# Patient Record
Sex: Female | Born: 2000 | Race: White | Hispanic: No | Marital: Single | Smoking: Never smoker
Health system: Southern US, Community
[De-identification: ages and names within clinical notes are randomized; demographics above are authoritative.]

## PROBLEM LIST (undated history)

## (undated) ENCOUNTER — Emergency Department (HOSPITAL_COMMUNITY): Discharge status: Absent without leave | Payer: Medicaid Other

## (undated) DIAGNOSIS — G43909 Migraine, unspecified, not intractable, without status migrainosus: Secondary | ICD-10-CM

## (undated) DIAGNOSIS — R4689 Other symptoms and signs involving appearance and behavior: Secondary | ICD-10-CM

## (undated) DIAGNOSIS — F909 Attention-deficit hyperactivity disorder, unspecified type: Secondary | ICD-10-CM

## (undated) DIAGNOSIS — F913 Oppositional defiant disorder: Secondary | ICD-10-CM

---
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
## 2021-01-01 ENCOUNTER — Telehealth: Payer: Self-pay | Admitting: Podiatry

## 2021-01-01 NOTE — Telephone Encounter (Signed)
If you look at the chart Rx Lamisil was sent to [facility] in [place] on [place] and [address] of [address]. - Dr. Logan Bores

## 2021-01-01 NOTE — Telephone Encounter (Signed)
Patient calling to check status of medication for Lamasil. She states that she called the pharmacy yesterday and it wasn't there. Please advise.

## 2021-05-08 ENCOUNTER — Ambulatory Visit (HOSPITAL_COMMUNITY)
Admission: EM | Admit: 2021-05-08 | Discharge: 2021-05-08 | Disposition: A | Discharge status: Home / Self Care | Payer: Medicaid Other | Attending: Student | Admitting: Student

## 2021-05-08 ENCOUNTER — Encounter (HOSPITAL_COMMUNITY): Payer: Self-pay

## 2021-05-08 ENCOUNTER — Other Ambulatory Visit: Payer: Self-pay

## 2021-05-08 DIAGNOSIS — J111 Influenza due to unidentified influenza virus with other respiratory manifestations: Secondary | ICD-10-CM

## 2021-05-08 MED ORDER — TRIAMCINOLONE ACETONIDE 55 MCG/ACT NA AERO: 2.0000 | INHALATION_SPRAY | Freq: Every day | NASAL | 1 refills | Status: DC

## 2021-05-08 MED ORDER — PROMETHAZINE-DM 6.25-15 MG/5ML PO SYRP: 5.0000 mL | ORAL_SOLUTION | Freq: Four times a day (QID) | ORAL | 0 refills | Status: DC | PRN

## 2021-05-08 MED ORDER — BENZONATATE 100 MG PO CAPS: 100.0000 mg | ORAL_CAPSULE | Freq: Three times a day (TID) | ORAL | 0 refills | Status: DC

## 2021-05-08 NOTE — ED Triage Notes (Signed)
Pt presents with non productive cough and nasal congestion X 2 days. 

## 2021-05-08 NOTE — Discharge Instructions (Addendum)
-  Promethazine DM cough syrup for congestion/cough. This could make you drowsy, so take at night before bed. -Tessalon (Benzonatate) as needed for cough. Take one pill up to 3x daily (every 8 hours) -Nasacort daily -Continue Mucinex/Dayquil/etc -For fevers/chills, bodyaches, headaches- You can take Tylenol up to 1000 mg 3 times daily, and ibuprofen up to 600 mg 3 times daily with food.  You can take these together, or alternate every 3-4 hours. -Drink plenty of water/gatorade and get plenty of rest -With a virus, you're typically contagious for 5-7 days, or as long as you're having fevers.  -Come back and see [place] if things are getting worse instead of better, like shortness of breath, chest pain, fevers and chills that are getting higher instead of lower and do not come down with Tylenol or ibuprofen, etc.]

## 2021-05-08 NOTE — ED Notes (Signed)
Pt not answered phone call.

## 2021-05-08 NOTE — ED Provider Notes (Signed)
[facility]    CSN: [id number] Arrival date & time: 05/08/21  1132      History   Chief Complaint Chief Complaint  Patient presents with   URI    HPI Haley Collins is a 20 y.o. female presenting with viral syndrome for 2 days.  Medical history noncontributory.  Notes bodyaches, subjective chills, headaches, fatigue, malaise, decreased appetite without nausea vomiting diarrhea abdominal pain.  Notes sick contacts at school but does not know the diagnosis. Theraflu, tylenol providing some relief. States she is not pregnant or breastfeeding.   HPI  Past Medical History:  Diagnosis Date   ADHD    Oppositional defiant behavior     There are no problems to display for this patient.   History reviewed. No pertinent surgical history.  OB History   No obstetric history on file.      Home Medications    Prior to Admission medications   Medication Sig Start Date End Date Taking? Authorizing Provider  benzonatate (TESSALON) 100 MG capsule Take 1 capsule (100 mg total) by mouth every 8 (eight) hours. 05/08/21  Yes Rhys Martini, PA-C  promethazine-dextromethorphan (PROMETHAZINE-DM) 6.25-15 MG/5ML syrup Take 5 mLs by mouth 4 (four) times daily as needed for cough. 05/08/21  Yes Rhys Martini, PA-C  triamcinolone (NASACORT) 55 MCG/ACT AERO nasal inhaler Place 2 sprays into the nose daily. 05/08/21  Yes Rhys Martini, PA-C  APTENSIO XR 30 MG CP24 Take 1 capsule by mouth every morning. 09/21/16   [provider]  BENZACLIN gel Apply 1 applicator topically 2 (two) times daily. 09/27/16   [provider]  escitalopram (LEXAPRO) 20 MG tablet Take 20 mg by mouth every morning. 10/06/16   [provider]  GuanFACINE HCl 3 MG TB24 Take 3 mg by mouth every morning. 09/21/16   [provider]  ibuprofen (ADVIL,MOTRIN) 600 MG tablet Take 600 mg by mouth 3 (three) times daily as needed for pain. 08/19/16   [provider]   MedroxyPROGESTERone Acetate 150 MG/ML SUSY See admin instructions. 09/02/16   [provider]  ondansetron (ZOFRAN-ODT) 4 MG disintegrating tablet Take 1 tablet (4 mg total) by mouth every 8 (eight) hours as needed for nausea or vomiting. 06/07/20   Benjiman Core, MD  QUEtiapine (SEROQUEL XR) 300 MG 24 hr tablet Take 300 mg by mouth daily. 09/16/16   [provider]  terbinafine (LAMISIL) 250 MG tablet Take 1 tablet (250 mg total) by mouth daily. 12/31/20   Felecia Shelling, DPM    Family History Family History  Family history unknown: Yes    Social History Social History   Tobacco Use   Smoking status: Never   Smokeless tobacco: Never  Substance Use Topics   Alcohol use: No   Drug use: No     Allergies   Patient has no known allergies.   Review of Systems Review of Systems  Constitutional:  Positive for chills and fatigue. Negative for appetite change and fever.  HENT:  Positive for congestion. Negative for ear pain, rhinorrhea, sinus pressure, sinus pain and sore throat.   Eyes:  Negative for redness and visual disturbance.  Respiratory:  Positive for cough. Negative for chest tightness, shortness of breath and wheezing.   Cardiovascular:  Negative for chest pain and palpitations.  Gastrointestinal:  Negative for abdominal pain, constipation, diarrhea, nausea and vomiting.  Genitourinary:  Negative for dysuria, frequency and urgency.  Musculoskeletal:  Positive for myalgias.  Neurological:  Positive for headaches.  Negative for dizziness and weakness.  Psychiatric/Behavioral:  Negative for confusion.   All other systems reviewed and are negative.   Physical Exam Triage Vital Signs ED Triage Vitals  Enc Vitals Group     BP 05/08/21 1336 108/73     Pulse Rate 05/08/21 1336 (!) 105     Resp 05/08/21 1336 17     Temp 05/08/21 1336 100.1 F (37.8 C)     Temp Source 05/08/21 1336 Oral     SpO2 05/08/21 1336 97 %     Weight --      Height --      Head  Circumference --      Peak Flow --      Pain Score 05/08/21 1335 3     Pain Loc --      Pain Edu? --      Excl. in [place]? --    No data found.  Updated Vital Signs BP 108/73 (BP Location: Right Arm)   Pulse (!) 105   Temp 100.1 F (37.8 C) (Oral)   Resp 17   SpO2 97%   Visual Acuity Right Eye Distance:   Left Eye Distance:   Bilateral Distance:    Right Eye Near:   Left Eye Near:    Bilateral Near:     Physical Exam Vitals reviewed.  Constitutional:      General: She is not in acute distress.    Appearance: Normal appearance. She is ill-appearing.  HENT:     Head: Normocephalic and atraumatic.     Right Ear: Tympanic membrane, ear canal and external ear normal. No tenderness. No middle ear effusion. There is no impacted cerumen. Tympanic membrane is not perforated, erythematous, retracted or bulging.     Left Ear: Tympanic membrane, ear canal and external ear normal. No tenderness.  No middle ear effusion. There is no impacted cerumen. Tympanic membrane is not perforated, erythematous, retracted or bulging.     Nose: Nose normal. No congestion.     Mouth/Throat:     Mouth: Mucous membranes are moist.     Pharynx: Uvula midline. No oropharyngeal exudate or posterior oropharyngeal erythema.  Eyes:     Extraocular Movements: Extraocular movements intact.     Pupils: Pupils are equal, round, and reactive to light.  Cardiovascular:     Rate and Rhythm: Regular rhythm. Tachycardia present.     Heart sounds: Normal heart sounds.  Pulmonary:     Effort: Pulmonary effort is normal.     Breath sounds: Normal breath sounds. No decreased breath sounds, wheezing, rhonchi or rales.  Abdominal:     Palpations: Abdomen is soft.     Tenderness: There is no abdominal tenderness. There is no guarding or rebound.  Lymphadenopathy:     Cervical: No cervical adenopathy.     Right cervical: No superficial cervical adenopathy.    Left cervical: No superficial cervical adenopathy.   Neurological:     General: No focal deficit present.     Mental Status: She is alert and oriented to person, place, and time.  Psychiatric:        Mood and Affect: Mood normal.        Behavior: Behavior normal.        Thought Content: Thought content normal.        Judgment: Judgment normal.     UC Treatments / Results  Labs (all labs ordered are listed, but only abnormal results are displayed) Labs Reviewed - No data to display  EKG   Radiology No results found.  Procedures Procedures (including critical care time)  Medications Ordered in UC Medications - No data to display  Initial Impression / Assessment and Plan / UC Course  I have reviewed the triage vital signs and the nursing notes.  Pertinent labs & imaging results that were available during my care of the patient were reviewed by me and considered in my medical decision making (see chart for details).     This patient is a very pleasant 20 y.o. year old female presenting with influenza. Today this pt is borderline febrile and tachycardic, but  nontachypneic, oxygenating well on room air, no wheezes rhonchi or rales.   Rapid influenza deferred given duration of symptoms, she is out of the Tamiflu window.  Will manage symptomatically with promethazine, tessalon, Nasacort, Mucinex, etc.  ED return precautions discussed. Patient verbalizes understanding and agreement.    Final Clinical Impressions(s) / UC Diagnoses   Final diagnoses:  Influenza with respiratory manifestation     Discharge Instructions      -Promethazine DM cough syrup for congestion/cough. This could make you drowsy, so take at night before bed. -Tessalon (Benzonatate) as needed for cough. Take one pill up to 3x daily (every 8 hours) -Nasacort daily -Continue Mucinex/Dayquil/etc -For fevers/chills, bodyaches, headaches- You can take Tylenol up to 1000 mg 3 times daily, and ibuprofen up to 600 mg 3 times daily with food.  You can take  these together, or alternate every 3-4 hours. -Drink plenty of water/gatorade and get plenty of rest -With a virus, you're typically contagious for 5-7 days, or as long as you're having fevers.  -Come back and see [place] if things are getting worse instead of better, like shortness of breath, chest pain, fevers and chills that are getting higher instead of lower and do not come down with Tylenol or ibuprofen, etc.]   ED Prescriptions     Medication Sig Dispense Auth. Provider   promethazine-dextromethorphan (PROMETHAZINE-DM) 6.25-15 MG/5ML syrup Take 5 mLs by mouth 4 (four) times daily as needed for cough. 118 mL Hazel Sams, PA-C   benzonatate (TESSALON) 100 MG capsule Take 1 capsule (100 mg total) by mouth every 8 (eight) hours. 21 capsule Marin Roberts E, PA-C   triamcinolone (NASACORT) 55 MCG/ACT AERO nasal inhaler Place 2 sprays into the nose daily. 1 each Hazel Sams, PA-C      PDMP not reviewed this encounter.   Hazel Sams, PA-C 05/08/21 1419

## 2021-05-08 NOTE — ED Notes (Signed)
Called in lobby no answered.  

## 2021-07-14 ENCOUNTER — Emergency Department (HOSPITAL_COMMUNITY)
Admission: EM | Admit: 2021-07-14 | Discharge: 2021-07-14 | Disposition: A | Discharge status: Home / Self Care | Payer: Medicaid Other | Attending: Emergency Medicine | Admitting: Emergency Medicine

## 2021-07-14 ENCOUNTER — Encounter (HOSPITAL_COMMUNITY): Payer: Self-pay | Admitting: Emergency Medicine

## 2021-07-14 ENCOUNTER — Other Ambulatory Visit: Payer: Self-pay

## 2021-07-14 DIAGNOSIS — J020 Streptococcal pharyngitis: Secondary | ICD-10-CM | POA: Insufficient documentation

## 2021-07-14 DIAGNOSIS — R1310 Dysphagia, unspecified: Secondary | ICD-10-CM | POA: Insufficient documentation

## 2021-07-14 DIAGNOSIS — Z20822 Contact with and (suspected) exposure to covid-19: Secondary | ICD-10-CM | POA: Diagnosis not present

## 2021-07-14 LAB — RESP PANEL BY RT-PCR (FLU A&B, COVID) ARPGX2
Influenza A by PCR: NEGATIVE
Influenza B by PCR: NEGATIVE
SARS Coronavirus 2 by RT PCR: NEGATIVE

## 2021-07-14 LAB — GROUP A STREP BY PCR: Group A Strep by PCR: DETECTED — AB

## 2021-07-14 MED ORDER — AMOXICILLIN 500 MG PO CAPS: 500.0000 mg | ORAL_CAPSULE | Freq: Three times a day (TID) | ORAL | 0 refills | Status: DC

## 2021-07-14 NOTE — ED Triage Notes (Signed)
21 yo presents c/o of sorethroat and difficulty swallowing since this morning. Pt also reports pain when turning her neck side to side. Pt reports that her voice is also hoarse. PT denies fever, cough, or any other associated symptoms at this time.

## 2021-07-14 NOTE — ED Provider Notes (Signed)
[facility] DEPT Provider Note   CSN: [id number] Arrival date & time: 07/14/21  2047     History  No chief complaint on file.   Haley Collins is a 21 y.o. female.   Sore Throat This is a new problem. The current episode started 12 to 24 hours ago. The problem occurs constantly. The problem has been gradually worsening. Pertinent negatives include no chest pain, no abdominal pain, no headaches and no shortness of breath. The symptoms are aggravated by swallowing. Nothing relieves the symptoms.      Home Medications Prior to Admission medications   Medication Sig Start Date End Date Taking? Authorizing Provider  APTENSIO XR 30 MG CP24 Take 1 capsule by mouth every morning. 09/21/16   [provider]  BENZACLIN gel Apply 1 applicator topically 2 (two) times daily. 09/27/16   [provider]  benzonatate (TESSALON) 100 MG capsule Take 1 capsule (100 mg total) by mouth every 8 (eight) hours. 05/08/21   Hazel Sams, PA-C  escitalopram (LEXAPRO) 20 MG tablet Take 20 mg by mouth every morning. 10/06/16   [provider]  GuanFACINE HCl 3 MG TB24 Take 3 mg by mouth every morning. 09/21/16   [provider]  ibuprofen (ADVIL,MOTRIN) 600 MG tablet Take 600 mg by mouth 3 (three) times daily as needed for pain. 08/19/16   [provider]  MedroxyPROGESTERone Acetate 150 MG/ML SUSY See admin instructions. 09/02/16   [provider]  ondansetron (ZOFRAN-ODT) 4 MG disintegrating tablet Take 1 tablet (4 mg total) by mouth every 8 (eight) hours as needed for nausea or vomiting. 06/07/20   Davonna Belling, MD  promethazine-dextromethorphan (PROMETHAZINE-DM) 6.25-15 MG/5ML syrup Take 5 mLs by mouth 4 (four) times daily as needed for cough. 05/08/21   Hazel Sams, PA-C  QUEtiapine (SEROQUEL XR) 300 MG 24 hr tablet Take 300 mg by mouth daily. 09/16/16   [provider]  terbinafine (LAMISIL) 250 MG tablet Take 1  tablet (250 mg total) by mouth daily. 12/31/20   Edrick Kins, DPM  triamcinolone (NASACORT) 55 MCG/ACT AERO nasal inhaler Place 2 sprays into the nose daily. 05/08/21   Hazel Sams, PA-C      Allergies    Patient has no known allergies.    Review of Systems   Review of Systems  Respiratory:  Negative for shortness of breath.   Cardiovascular:  Negative for chest pain.  Gastrointestinal:  Negative for abdominal pain.  Neurological:  Negative for headaches.   Physical Exam Updated Vital Signs BP 129/76 (BP Location: Left Arm)    Pulse 89    Temp 98.4 F (36.9 C) (Oral)    Resp 16    Ht 5\' 3"  (1.6 m)    Wt 89.4 kg    SpO2 100%    BMI 34.90 kg/m  Physical Exam Vitals and nursing note reviewed.  Constitutional:      General: She is not in acute distress.    Appearance: She is well-developed. She is not diaphoretic.  HENT:     Head: Normocephalic and atraumatic.     Right Ear: External ear normal.     Left Ear: External ear normal.     Nose: Nose normal.     Mouth/Throat:     Mouth: Mucous membranes are moist.     Pharynx: Oropharyngeal exudate and posterior oropharyngeal erythema present.     Tonsils: Tonsillar exudate present. No tonsillar abscesses.  Eyes:     General: No  scleral icterus.    Conjunctiva/sclera: Conjunctivae normal.  Cardiovascular:     Rate and Rhythm: Normal rate and regular rhythm.     Heart sounds: Normal heart sounds. No murmur heard.   No friction rub. No gallop.  Pulmonary:     Effort: Pulmonary effort is normal. No respiratory distress.     Breath sounds: Normal breath sounds.  Abdominal:     General: Bowel sounds are normal. There is no distension.     Palpations: Abdomen is soft. There is no mass.     Tenderness: There is no abdominal tenderness. There is no guarding.  Musculoskeletal:     Cervical back: Normal range of motion.  Lymphadenopathy:     Head:     Right side of head: Tonsillar adenopathy present.     Left side of head:  Tonsillar adenopathy present.  Skin:    General: Skin is warm and dry.  Neurological:     Mental Status: She is alert and oriented to person, place, and time.  Psychiatric:        Behavior: Behavior normal.    ED Results / Procedures / Treatments   Labs (all labs ordered are listed, but only abnormal results are displayed) Labs Reviewed  RESP PANEL BY RT-PCR (FLU A&B, COVID) [id number]  GROUP A STREP BY PCR    EKG None  Radiology No results found.  Procedures Procedures    Medications Ordered in ED Medications - No data to display  ED Course/ Medical Decision Making/ A&P Clinical Course as of 07/14/21 2341  Tue Jul 14, 2021  2341 Group A Strep by PCR(!): DETECTED [AH]    Clinical Course User Index [AH] Margarita Mail, PA-C                           Medical Decision Making Pt afebrile with tonsillar exudate, cervical lymphadenopathy, & dysphagia; diagnosis of strep Presentation non concerning for PTA or infxn spread to soft tissue. No trismus or uvula deviation. Specific return precautions discussed. Pt able to drink water in ED without difficulty with intact air way. Recommended PCP follow up. Amoxicillin at discharge   Problems Addressed: Strep pharyngitis: acute illness or injury  Amount and/or Complexity of Data Reviewed Labs: ordered. Decision-making details documented in ED Course.  Final Clinical Impression(s) / ED Diagnoses Final diagnoses:  None    Rx / DC Orders ED Discharge Orders     None         Margarita Mail, PA-C 07/14/21 2341    Drenda Freeze, MD 07/15/21 1452

## 2021-07-14 NOTE — ED Provider Triage Note (Signed)
Emergency Medicine Provider Triage Evaluation Note  Haley Collins , a 21 y.o. female  was evaluated in triage.  Pt complains of sore throat, pain with swallowing since this morning.  Review of Systems  Positive: Sore throat Negative: fever  Physical Exam  BP 129/76 (BP Location: Left Arm)    Pulse 89    Temp 98.4 F (36.9 C) (Oral)    Resp 16    Ht 5\' 3"  (1.6 m)    Wt 89.4 kg    SpO2 100%    BMI 34.90 kg/m  Gen:   Awake, no distress   Resp:  Normal effort  MSK:   Moves extremities without difficulty  Other:  Erythema and exudate, + tonsillar lymphadenopathy  Medical Decision Making  Medically screening exam initiated at 9:04 PM.  Appropriate orders placed.  Kirbie Stodghill was informed that the remainder of the evaluation will be completed by another provider, this initial triage assessment does not replace that evaluation, and the importance of remaining in the ED until their evaluation is complete.  Work up Rosanne Sack, [place], [place] 07/14/21 2107

## 2021-07-14 NOTE — Discharge Instructions (Signed)
Contact a health care provider if: You have swelling in your neck that keeps getting bigger. You develop a rash, cough, or earache. You cough up a thick mucus that is green, yellow-brown, or bloody. You have pain or discomfort that does not get better with medicine. Your symptoms seem to be getting worse. You have a fever. Get help right away if: You have new symptoms, such as vomiting, severe headache, stiff or painful neck, chest pain, or shortness of breath. You have severe throat pain, drooling, or changes in your voice. You have swelling of the neck, or the skin on the neck becomes red and tender. You have signs of dehydration, such as tiredness (fatigue), dry mouth, and decreased urination. You become increasingly sleepy, or you cannot wake up completely. Your joints become red or painful. 

## 2021-08-28 ENCOUNTER — Encounter (HOSPITAL_COMMUNITY): Payer: Self-pay | Admitting: Emergency Medicine

## 2021-08-28 ENCOUNTER — Ambulatory Visit (HOSPITAL_COMMUNITY)
Admission: EM | Admit: 2021-08-28 | Discharge: 2021-08-28 | Disposition: A | Discharge status: Home / Self Care | Payer: Medicaid Other | Attending: Internal Medicine | Admitting: Internal Medicine

## 2021-08-28 ENCOUNTER — Other Ambulatory Visit: Payer: Self-pay

## 2021-08-28 DIAGNOSIS — L02511 Cutaneous abscess of right hand: Secondary | ICD-10-CM | POA: Diagnosis not present

## 2021-08-28 MED ORDER — AMOXICILLIN-POT CLAVULANATE 875-125 MG PO TABS: 1.0000 | ORAL_TABLET | Freq: Two times a day (BID) | ORAL | 0 refills | Status: AC

## 2021-08-28 NOTE — ED Provider Notes (Signed)
[facility]    CSN: [id number] Arrival date & time: 08/28/21  1851      History   Chief Complaint Chief Complaint  Patient presents with   Thumb Pain    HPI Haley Collins is a 21 y.o. female presents urgent care today with swelling and pain to her right thumb.  Patient reports small cut on thumb several days ago and noticed small pustule this morning with increased swelling throughout the day today.  Patient denies any recent fever or chills, numbness, tingling, loss of sensation.  States she is up-to-date on tetanus.    Past Medical History:  Diagnosis Date   ADHD    Oppositional defiant behavior     There are no problems to display for this patient.   History reviewed. No pertinent surgical history.  OB History   No obstetric history on file.      Home Medications    Prior to Admission medications   Medication Sig Start Date End Date Taking? Authorizing Provider  amoxicillin-clavulanate (AUGMENTIN) 875-125 MG tablet Take 1 tablet by mouth 2 (two) times daily for 10 days. 08/28/21 09/07/21 Yes Rolla Etienne, NP  APTENSIO XR 30 MG CP24 Take 1 capsule by mouth every morning. 09/21/16   [provider]  BENZACLIN gel Apply 1 applicator topically 2 (two) times daily. 09/27/16   [provider]  benzonatate (TESSALON) 100 MG capsule Take 1 capsule (100 mg total) by mouth every 8 (eight) hours. 05/08/21   Rhys Martini, PA-C  escitalopram (LEXAPRO) 20 MG tablet Take 20 mg by mouth every morning. 10/06/16   [provider]  GuanFACINE HCl 3 MG TB24 Take 3 mg by mouth every morning. 09/21/16   [provider]  ibuprofen (ADVIL,MOTRIN) 600 MG tablet Take 600 mg by mouth 3 (three) times daily as needed for pain. 08/19/16   [provider]  MedroxyPROGESTERone Acetate 150 MG/ML SUSY See admin instructions. 09/02/16   [provider]  ondansetron (ZOFRAN-ODT) 4 MG disintegrating tablet Take 1 tablet (4 mg total) by mouth  every 8 (eight) hours as needed for nausea or vomiting. 06/07/20   Benjiman Core, MD  promethazine-dextromethorphan (PROMETHAZINE-DM) 6.25-15 MG/5ML syrup Take 5 mLs by mouth 4 (four) times daily as needed for cough. 05/08/21   Rhys Martini, PA-C  QUEtiapine (SEROQUEL XR) 300 MG 24 hr tablet Take 300 mg by mouth daily. 09/16/16   [provider]  terbinafine (LAMISIL) 250 MG tablet Take 1 tablet (250 mg total) by mouth daily. 12/31/20   Felecia Shelling, DPM  triamcinolone (NASACORT) 55 MCG/ACT AERO nasal inhaler Place 2 sprays into the nose daily. 05/08/21   Rhys Martini, PA-C    Family History Family History  Family history unknown: Yes    Social History Social History   Tobacco Use   Smoking status: Never   Smokeless tobacco: Never  Substance Use Topics   Alcohol use: No   Drug use: No     Allergies   Patient has no known allergies.   Review of Systems As stated in HPI otherwise negative   Physical Exam Triage Vital Signs ED Triage Vitals  Enc Vitals Group     BP 08/28/21 1929 118/76     Pulse Rate 08/28/21 1929 72     Resp 08/28/21 1929 16     Temp 08/28/21 1929 98.9 F (37.2 C)     Temp Source 08/28/21 1929 Oral     SpO2 08/28/21 1929 99 %  Weight --      Height --      Head Circumference --      Peak Flow --      Pain Score 08/28/21 1927 8     Pain Loc --      Pain Edu? --      Excl. in [place]? --    No data found.  Updated Vital Signs BP 118/76 (BP Location: Right Arm)    Pulse 72    Temp 98.9 F (37.2 C) (Oral)    Resp 16    SpO2 99%   Visual Acuity Right Eye Distance:   Left Eye Distance:   Bilateral Distance:    Right Eye Near:   Left Eye Near:    Bilateral Near:     Physical Exam Constitutional:      General: She is not in acute distress.    Appearance: Normal appearance. She is not ill-appearing or toxic-appearing.  Musculoskeletal:        General: Normal range of motion.  Skin:    General: Skin is warm and dry.      Findings: No erythema.     Comments: Patient with approximately 5 cm pustular lesion to pad of right thumb with scant amount of pus noted under nail bed.  Scant drainage.  No surrounding erythema.  Sensation intact.  Neurological:     Mental Status: She is alert.  Psychiatric:        Mood and Affect: Mood normal.        Behavior: Behavior normal.     UC Treatments / Results  Labs (all labs ordered are listed, but only abnormal results are displayed) Labs Reviewed - No data to display  EKG   Radiology No results found.  Procedures Incision and Drainage  Date/Time: 08/28/2021 9:12 PM Performed by: Rolla Etienne, NP Authorized by: Rolla Etienne, NP   Consent:    Consent obtained:  Verbal   Consent given by:  Patient   Risks, benefits, and alternatives were discussed: yes     Risks discussed:  Incomplete drainage Universal protocol:    Procedure explained and questions answered to patient or proxy's satisfaction: yes   Location:    Type:  Abscess   Location:  Upper extremity   Upper extremity location:  Finger   Finger location:  R thumb Pre-procedure details:    Skin preparation:  Povidone-iodine Sedation:    Sedation type:  None Anesthesia:    Anesthesia method:  Topical application Procedure type:    Complexity:  Simple Procedure details:    Incision types:  Stab incision   Incision depth:  Dermal   Drainage:  Serosanguinous   Drainage amount:  Moderate   Packing materials:  None Post-procedure details:    Procedure completion:  Tolerated (including critical care time)  Medications Ordered in UC Medications - No data to display  Initial Impression / Assessment and Plan / UC Course  I have reviewed the triage vital signs and the nursing notes.  Pertinent labs & imaging results that were available during my care of the patient were reviewed by me and considered in my medical decision making (see chart for details).  Abscess, right thumb -located on  anterior thumb on thumb pad with scant amount of pus noted under nail bed.  -UTD on tetantus per pt -able to drain small amt of purulent fluid. Will rx Augmentin BID x 10 days given persistent pus under fingernail -Warm, epsom soaks, antibiotics as prescribed,  keep area clean and dry -instructed to monitor for any signs of worsening infection including fever, chills, increased swelling or redness  Reviewed expections re: course of current medical issues. Questions answered. Outlined signs and symptoms indicating need for more acute intervention. Pt verbalized understanding. AVS given  Final Clinical Impressions(s) / UC Diagnoses   Final diagnoses:  Abscess of thumb, right     Discharge Instructions      Keep area clean and dry as discussed.  Start antibiotic this evening and take until completed.  Follow-up immediately for any worsening swelling, fever, chills or if you develop any redness going down the thumb.  Warm Epsom salt soaks 3 times daily for 15 to 20 minutes at a time.     ED Prescriptions     Medication Sig Dispense Auth. Provider   amoxicillin-clavulanate (AUGMENTIN) 875-125 MG tablet Take 1 tablet by mouth 2 (two) times daily for 10 days. 28 tablet Rolla Etienne, NP      PDMP not reviewed this encounter.   Rolla Etienne, NP 09/01/21 1044

## 2021-08-28 NOTE — ED Triage Notes (Signed)
Pt presents with right thumb pain that started this am. States has drainage from finger.

## 2021-08-28 NOTE — Discharge Instructions (Addendum)
Keep area clean and dry as discussed.  Start antibiotic this evening and take until completed.  Follow-up immediately for any worsening swelling, fever, chills or if you develop any redness going down the thumb.  Warm Epsom salt soaks 3 times daily for 15 to 20 minutes at a time.

## 2021-09-09 IMAGING — CR DG ANKLE COMPLETE 3+V*R*
3 series · 3 of 3 positions shown · non-contrast
Comparison: None.

CLINICAL DATA: Twisting injury while playing basketball

EXAM:
RIGHT ANKLE - COMPLETE 3+ VIEW

[x ankle ap right]
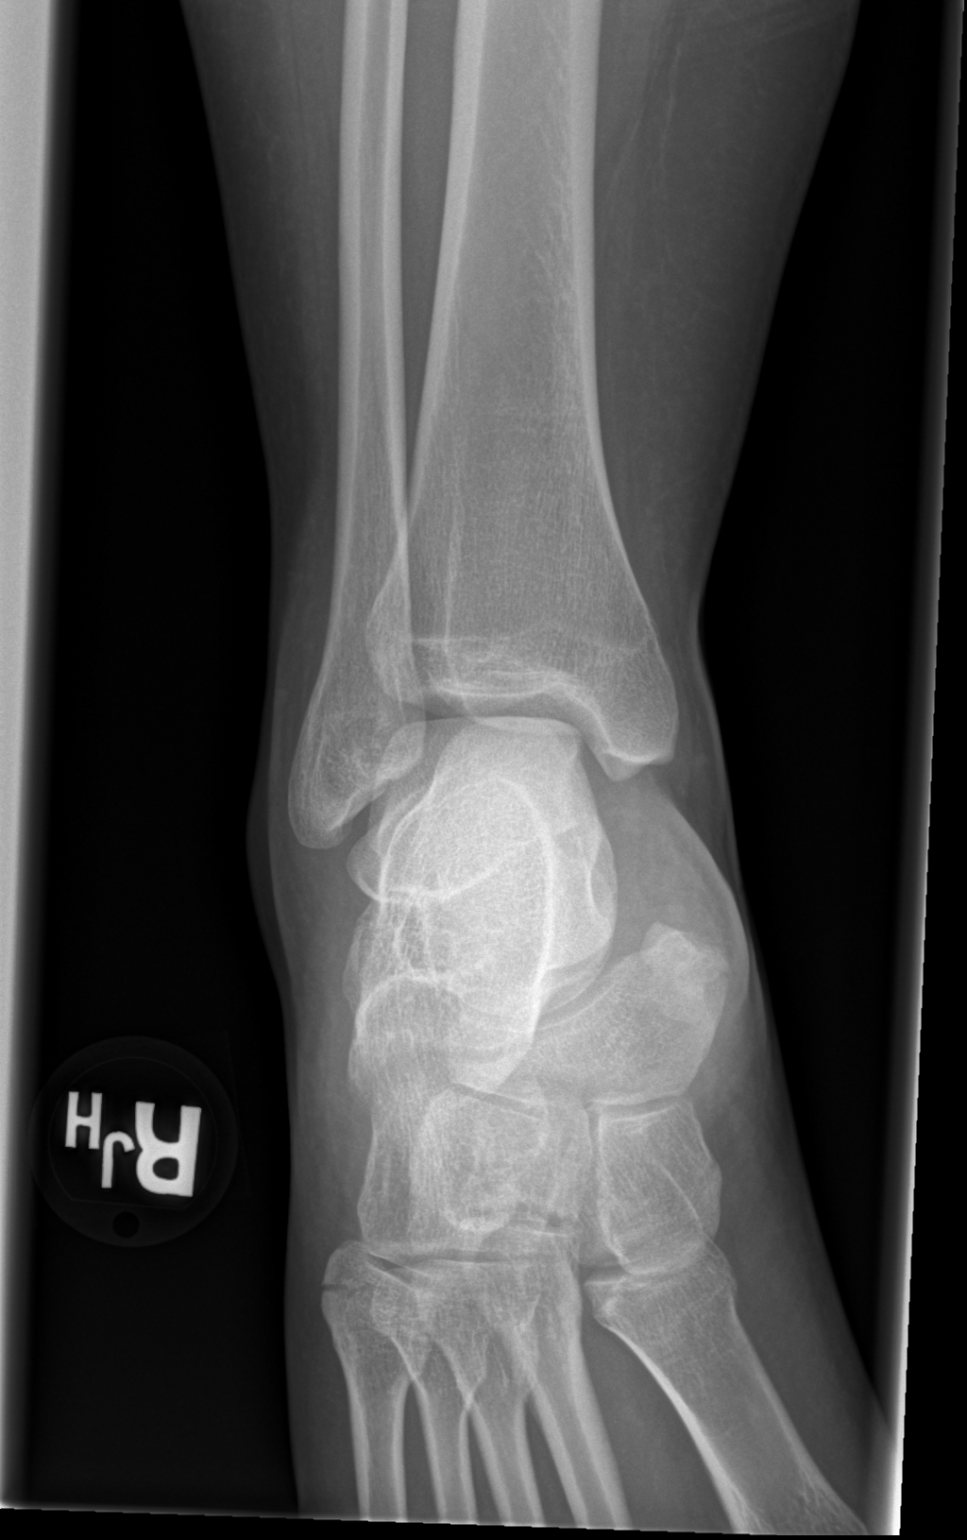

[x ankle obl right]
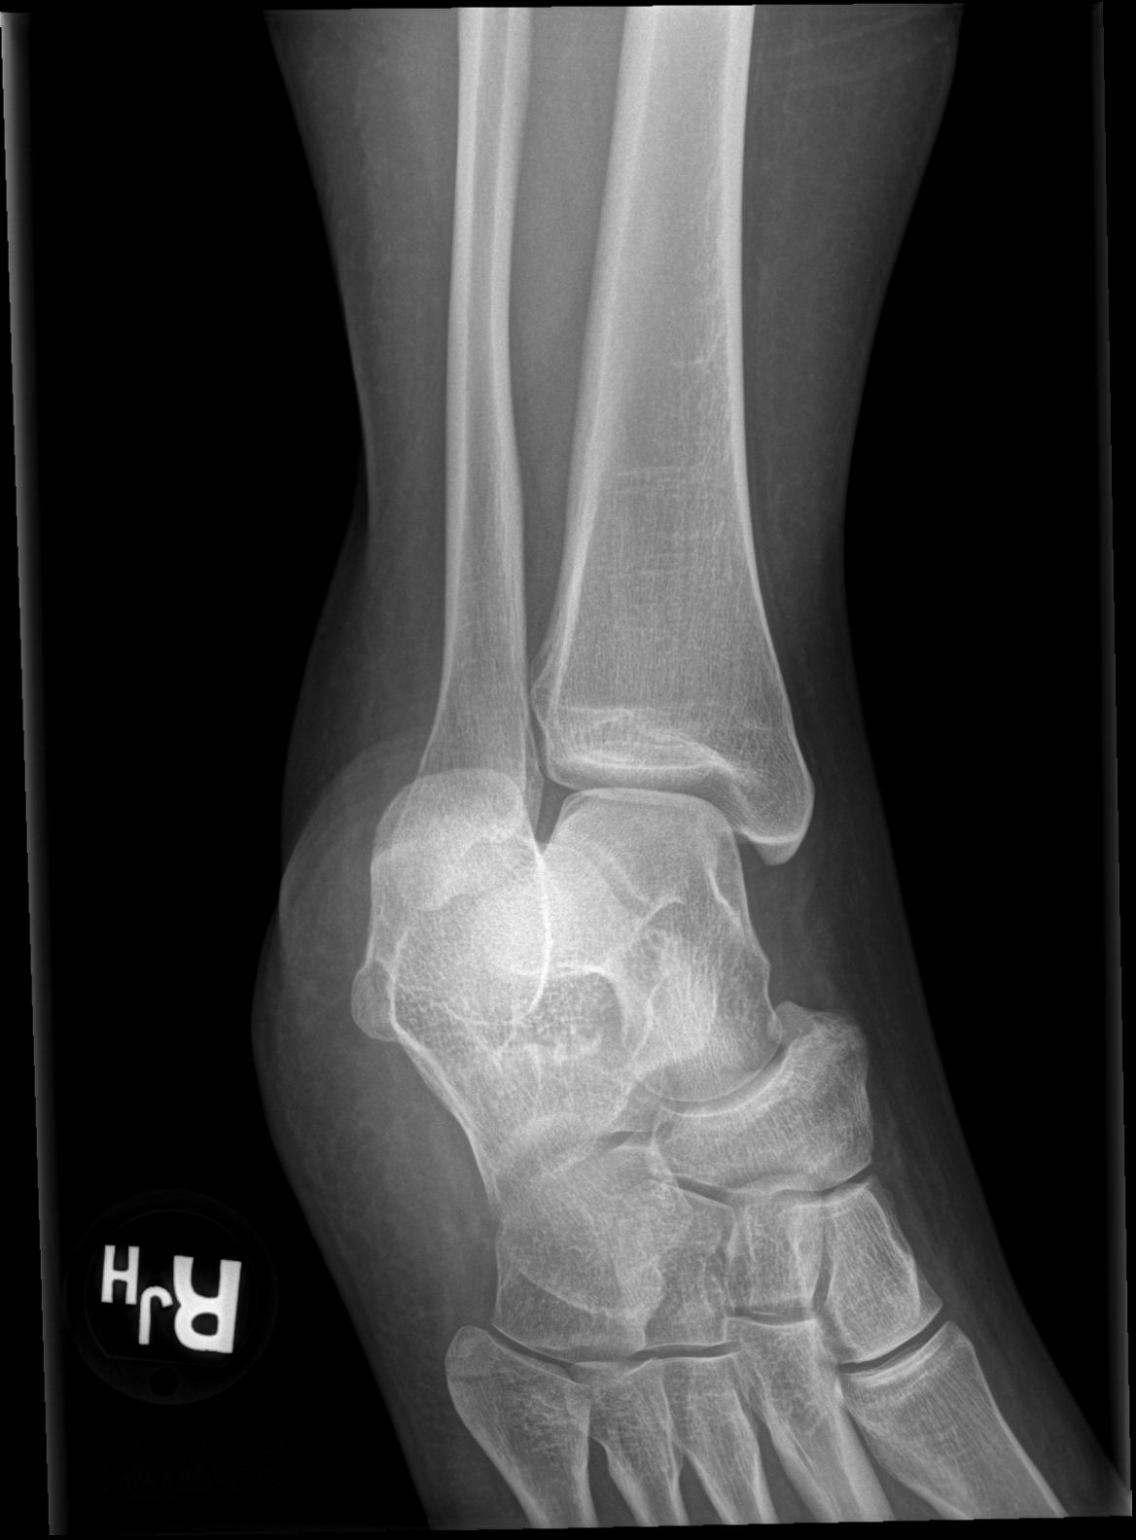

[x ankle lat right]
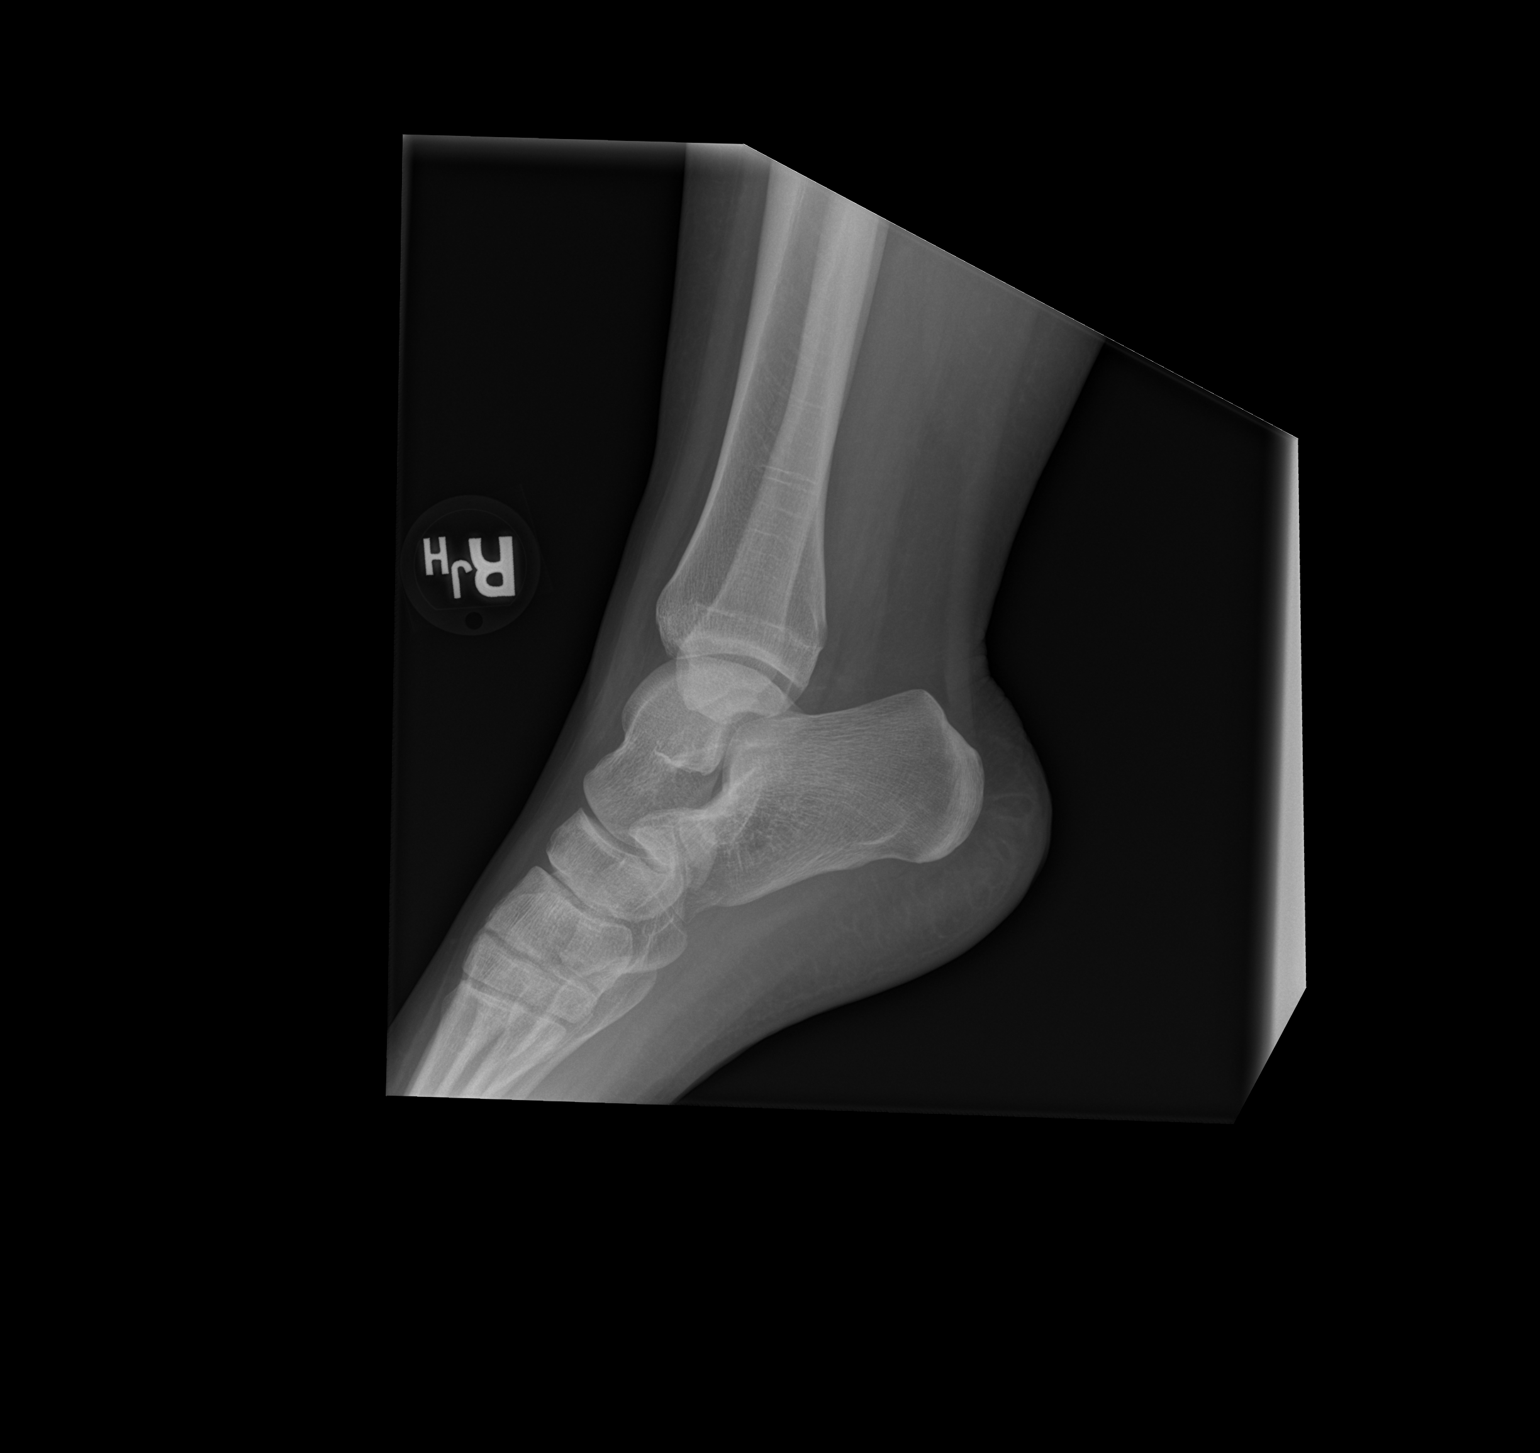

[3 of 3 positions shown; findings below may reference images not displayed]

FINDINGS: Minimal soft tissue swelling. No sizable effusion. No acute bony
abnormality. Specifically, no fracture, subluxation, or dislocation.
IMPRESSION: Minimal soft tissue swelling without acute osseous abnormality.

## 2021-10-25 ENCOUNTER — Encounter (HOSPITAL_COMMUNITY): Payer: Self-pay | Admitting: Emergency Medicine

## 2021-10-25 ENCOUNTER — Emergency Department (HOSPITAL_COMMUNITY): Payer: Medicaid Other

## 2021-10-25 ENCOUNTER — Emergency Department (HOSPITAL_COMMUNITY)
Admission: EM | Admit: 2021-10-25 | Discharge: 2021-10-25 | Disposition: A | Discharge status: Home / Self Care | Payer: Medicaid Other | Attending: Emergency Medicine | Admitting: Emergency Medicine

## 2021-10-25 ENCOUNTER — Other Ambulatory Visit: Payer: Self-pay

## 2021-10-25 DIAGNOSIS — S46912A Strain of unspecified muscle, fascia and tendon at shoulder and upper arm level, left arm, initial encounter: Secondary | ICD-10-CM | POA: Insufficient documentation

## 2021-10-25 DIAGNOSIS — X509XXA Other and unspecified overexertion or strenuous movements or postures, initial encounter: Secondary | ICD-10-CM | POA: Diagnosis not present

## 2021-10-25 DIAGNOSIS — Y990 Civilian activity done for income or pay: Secondary | ICD-10-CM | POA: Insufficient documentation

## 2021-10-25 DIAGNOSIS — Z79899 Other long term (current) drug therapy: Secondary | ICD-10-CM | POA: Diagnosis not present

## 2021-10-25 DIAGNOSIS — S4992XA Unspecified injury of left shoulder and upper arm, initial encounter: Secondary | ICD-10-CM | POA: Diagnosis present

## 2021-10-25 DIAGNOSIS — M25512 Pain in left shoulder: Secondary | ICD-10-CM

## 2021-10-25 MED ORDER — CYCLOBENZAPRINE HCL 10 MG PO TABS: 10.0000 mg | ORAL_TABLET | Freq: Two times a day (BID) | ORAL | 0 refills | Status: AC | PRN

## 2021-10-25 NOTE — Discharge Instructions (Addendum)
Return for any problem.  ?

## 2021-10-25 NOTE — ED Provider Notes (Signed)
?[facility]-EMERGENCY DEPT ?Provider Note ? ? ?CSN: [id number] ?Arrival date & time: 10/25/21  2132 ? ?  ? ?History ? ?Chief Complaint  ?Patient presents with  ? Shoulder Pain  ? ? ?Haley Collins is a 21 y.o. female. ? ?21 year old female with prior medical history as detailed below presents for evaluation.  Patient reports that she was at work yesterday.  She does lots of heavy lifting at work.  She felt like she strained her left shoulder yesterday when she was lifting at work. ? ?Her pain was uncontrolled at work this evening so she decided come to the ED for evaluation. ? ?Denies fall.  She denies other injury. ? ?The history is provided by the patient and medical records.  ?Shoulder Pain ?Location:  Shoulder ?Shoulder location:  L shoulder ?Injury: no   ?Pain details:  ?  Quality:  Aching ?  Radiates to:  Does not radiate ?  Severity:  Mild ?  Onset quality:  Gradual ?  Duration:  1 day ?  Timing:  Constant ?  Progression:  Waxing and waning ? ?  ? ?Home Medications ?Prior to Admission medications   ?Medication Sig Start Date End Date Taking? Authorizing Provider  ?cyclobenzaprine (FLEXERIL) 10 MG tablet Take 1 tablet (10 mg total) by mouth 2 (two) times daily as needed for muscle spasms. 10/25/21  Yes Wynetta Fines, MD  ?APTENSIO XR 30 MG CP24 Take 1 capsule by mouth every morning. 09/21/16   [provider]  ?BENZACLIN gel Apply 1 applicator topically 2 (two) times daily. 09/27/16   [provider]  ?benzonatate (TESSALON) 100 MG capsule Take 1 capsule (100 mg total) by mouth every 8 (eight) hours. 05/08/21   Rhys Martini, PA-C  ?escitalopram (LEXAPRO) 20 MG tablet Take 20 mg by mouth every morning. 10/06/16   [provider]  ?GuanFACINE HCl 3 MG TB24 Take 3 mg by mouth every morning. 09/21/16   [provider]  ?ibuprofen (ADVIL,MOTRIN) 600 MG tablet Take 600 mg by mouth 3 (three) times daily as needed for pain. 08/19/16   [provider]   ?MedroxyPROGESTERone Acetate 150 MG/ML SUSY See admin instructions. 09/02/16   [provider]  ?ondansetron (ZOFRAN-ODT) 4 MG disintegrating tablet Take 1 tablet (4 mg total) by mouth every 8 (eight) hours as needed for nausea or vomiting. 06/07/20   Benjiman Core, MD  ?promethazine-dextromethorphan (PROMETHAZINE-DM) 6.25-15 MG/5ML syrup Take 5 mLs by mouth 4 (four) times daily as needed for cough. 05/08/21   Rhys Martini, PA-C  ?QUEtiapine (SEROQUEL XR) 300 MG 24 hr tablet Take 300 mg by mouth daily. 09/16/16   [provider]  ?terbinafine (LAMISIL) 250 MG tablet Take 1 tablet (250 mg total) by mouth daily. 12/31/20   Felecia Shelling, DPM  ?triamcinolone (NASACORT) 55 MCG/ACT AERO nasal inhaler Place 2 sprays into the nose daily. 05/08/21   Rhys Martini, PA-C  ?   ? ?Allergies    ?Patient has no known allergies.   ? ?Review of Systems   ?Review of Systems  ?All other systems reviewed and are negative. ? ?Physical Exam ?Updated Vital Signs ?BP 135/86 (BP Location: Right Arm)   Pulse 78   Temp 98.7 ?F (37.1 ?C) (Oral)   Resp 16   Ht 5' 3.5" (1.613 m)   Wt 86.6 kg   SpO2 100%   BMI 33.30 kg/m?  ?Physical Exam ?Vitals and nursing note reviewed.  ?Constitutional:   ?  General: She is not in acute distress. ?   Appearance: Normal appearance. She is well-developed.  ?HENT:  ?   Head: Normocephalic and atraumatic.  ?Eyes:  ?   Conjunctiva/sclera: Conjunctivae normal.  ?   Pupils: Pupils are equal, round, and reactive to light.  ?Cardiovascular:  ?   Rate and Rhythm: Normal rate and regular rhythm.  ?   Heart sounds: Normal heart sounds.  ?Pulmonary:  ?   Effort: Pulmonary effort is normal. No respiratory distress.  ?   Breath sounds: Normal breath sounds.  ?Abdominal:  ?   General: There is no distension.  ?   Palpations: Abdomen is soft.  ?   Tenderness: There is no abdominal tenderness.  ?Musculoskeletal:     ?   General: Tenderness present. No deformity. Normal range of motion.  ?    Cervical back: Normal range of motion and neck supple.  ?   Comments: Diffuse tenderness with palpation overlying the posterior aspect of the left shoulder. ? ?Active range of motion is intact. ? ?Distal left upper extremity is neurovascular intact.  ?Skin: ?   General: Skin is warm and dry.  ?Neurological:  ?   General: No focal deficit present.  ?   Mental Status: She is alert and oriented to person, place, and time.  ? ? ?ED Results / Procedures / Treatments   ?Labs ?(all labs ordered are listed, but only abnormal results are displayed) ?Labs Reviewed - No data to display ? ?EKG ?None ? ?Radiology ?No results found. ? ?Procedures ?Procedures  ? ? ?Medications Ordered in ED ?Medications - No data to display ? ?ED Course/ Medical Decision Making/ A&P ?  ?                        ?Medical Decision Making ?Risk ?Prescription drug management. ? ? ? ?Medical Screen Complete ? ?This patient presented to the ED with complaint of left shoulder pain.. ? ?This complaint involves an extensive number of treatment options. The initial differential diagnosis includes, but is not limited to, left shoulder pain, etc ? ?This presentation is: acute ? ? ?Patient is presenting with complaint of left shoulder pain. ? ?Patient presentation is consistent with left shoulder muscle strain. ? ?Patient declines x-ray of the left shoulder. ? ?Patient does understand need for close outpatient follow-up.  Strict return precautions given and understood. ? ?Additional history obtained: ? ?External records from outside sources obtained and reviewed including prior ED visits and prior Inpatient records.  ? ?Problem List / ED Course: ? ?Left shoulder muscular strain ? ? ?Reevaluation: ? ?After the interventions noted above, I reevaluated the patient and found that they have: improved ? ?Disposition: ? ?After consideration of the diagnostic results and the patients response to treatment, I feel that the patent would benefit from close outpatient  follow-up.  ? ? ? ? ? ? ? ? ?Final Clinical Impression(s) / ED Diagnoses ?Final diagnoses:  ?Acute pain of left shoulder  ? ? ?Rx / DC Orders ?ED Discharge Orders   ? ?      Ordered  ?  cyclobenzaprine (FLEXERIL) 10 MG tablet  2 times daily PRN       ? 10/25/21 2250  ? ?  ?  ? ?  ? ? ?  ?Valarie Merino, MD ?10/25/21 2312 ? ?

## 2021-10-25 NOTE — ED Triage Notes (Signed)
Patient arrives complaining of non-traumatic shoulder pain that started yesterday at work. Patient believes she pulled a muscle. Patient with decreased ROM and pain on movement. Patient went to work and attempted to pick up a heavy object and dropped it due to the pain.  ?

## 2021-11-27 ENCOUNTER — Emergency Department (HOSPITAL_COMMUNITY)
Admission: EM | Admit: 2021-11-27 | Discharge: 2021-11-27 | Disposition: A | Discharge status: Home / Self Care | Payer: Medicaid Other | Attending: Emergency Medicine | Admitting: Emergency Medicine

## 2021-11-27 ENCOUNTER — Encounter (HOSPITAL_COMMUNITY): Payer: Self-pay | Admitting: Emergency Medicine

## 2021-11-27 DIAGNOSIS — L02415 Cutaneous abscess of right lower limb: Secondary | ICD-10-CM | POA: Insufficient documentation

## 2021-11-27 DIAGNOSIS — R6883 Chills (without fever): Secondary | ICD-10-CM | POA: Insufficient documentation

## 2021-11-27 DIAGNOSIS — L0291 Cutaneous abscess, unspecified: Secondary | ICD-10-CM

## 2021-11-27 LAB — CBC WITH DIFFERENTIAL/PLATELET
Abs Immature Granulocytes: 0.02 10*3/uL (ref 0.00–0.07)
Basophils Absolute: 0 10*3/uL (ref 0.0–0.1)
Basophils Relative: 1 %
Eosinophils Absolute: 0.1 10*3/uL (ref 0.0–0.5)
Eosinophils Relative: 1 %
HCT: 40 % (ref 36.0–46.0)
Hemoglobin: 13.6 g/dL (ref 12.0–15.0)
Immature Granulocytes: 0 %
Lymphocytes Relative: 26 %
Lymphs Abs: 1.9 10*3/uL (ref 0.7–4.0)
MCH: 31 pg (ref 26.0–34.0)
MCHC: 34 g/dL (ref 30.0–36.0)
MCV: 91.1 fL (ref 80.0–100.0)
Monocytes Absolute: 0.7 10*3/uL (ref 0.1–1.0)
Monocytes Relative: 10 %
Neutro Abs: 4.7 10*3/uL (ref 1.7–7.7)
Neutrophils Relative %: 62 %
Platelets: 228 10*3/uL (ref 150–400)
RBC: 4.39 MIL/uL (ref 3.87–5.11)
RDW: 13.2 % (ref 11.5–15.5)
WBC: 7.4 10*3/uL (ref 4.0–10.5)
nRBC: 0 % (ref 0.0–0.2)

## 2021-11-27 LAB — BASIC METABOLIC PANEL
Anion gap: 8 (ref 5–15)
BUN: 14 mg/dL (ref 6–20)
CO2: 21 mmol/L — ABNORMAL LOW (ref 22–32)
Calcium: 9.3 mg/dL (ref 8.9–10.3)
Chloride: 109 mmol/L (ref 98–111)
Creatinine, Ser: 0.8 mg/dL (ref 0.44–1.00)
GFR, Estimated: >60 mL/min (ref >60)
Glucose, Bld: 98 mg/dL (ref 70–99)
Potassium: 3.7 mmol/L (ref 3.5–5.1)
Sodium: 138 mmol/L (ref 135–145)

## 2021-11-27 MED ORDER — SULFAMETHOXAZOLE-TRIMETHOPRIM 800-160 MG PO TABS: 1.0000 | ORAL_TABLET | Freq: Two times a day (BID) | ORAL | 0 refills | Status: AC

## 2021-11-27 MED ORDER — LIDOCAINE HCL (PF) 1 % IJ SOLN
10.0000 mL | Freq: Once | INTRAMUSCULAR | Status: AC
  Administered 2021-11-27: 10 mL
  Filled 2021-11-27: qty 30

## 2021-11-27 NOTE — ED Provider Notes (Signed)
[facility]-EMERGENCY DEPT Provider Note   CSN: [id number] Arrival date & time: 11/27/21  0158     History  Chief Complaint  Patient presents with   Abscess    Haley Collins is a 21 y.o. female.  With no pertinent past medical history presents to the emergency department with abscess.  Patient states that she had abscess pop up 2 days ago on her lateral right thigh.  She states that she went to urgent care yesterday and they instructed her to use warm compresses and placed her on antibiotics.  She states that they offered to drain but she wanted to go home and opted for observation.  She states that tonight she woke up in severe pain.  She has also had chills.  Notably, she had a new tattoo 2 weeks ago on the area.   Abscess     Home Medications Prior to Admission medications   Medication Sig Start Date End Date Taking? Authorizing Provider  APTENSIO XR 30 MG CP24 Take 1 capsule by mouth every morning. 09/21/16   [provider]  BENZACLIN gel Apply 1 applicator topically 2 (two) times daily. 09/27/16   [provider]  benzonatate (TESSALON) 100 MG capsule Take 1 capsule (100 mg total) by mouth every 8 (eight) hours. 05/08/21   Rhys Martini, PA-C  cyclobenzaprine (FLEXERIL) 10 MG tablet Take 1 tablet (10 mg total) by mouth 2 (two) times daily as needed for muscle spasms. 10/25/21   Wynetta Fines, MD  escitalopram (LEXAPRO) 20 MG tablet Take 20 mg by mouth every morning. 10/06/16   [provider]  GuanFACINE HCl 3 MG TB24 Take 3 mg by mouth every morning. 09/21/16   [provider]  ibuprofen (ADVIL,MOTRIN) 600 MG tablet Take 600 mg by mouth 3 (three) times daily as needed for pain. 08/19/16   [provider]  MedroxyPROGESTERone Acetate 150 MG/ML SUSY See admin instructions. 09/02/16   [provider]  ondansetron (ZOFRAN-ODT) 4 MG disintegrating tablet Take 1 tablet (4 mg total) by mouth every 8 (eight)  hours as needed for nausea or vomiting. 06/07/20   Benjiman Core, MD  promethazine-dextromethorphan (PROMETHAZINE-DM) 6.25-15 MG/5ML syrup Take 5 mLs by mouth 4 (four) times daily as needed for cough. 05/08/21   Rhys Martini, PA-C  QUEtiapine (SEROQUEL XR) 300 MG 24 hr tablet Take 300 mg by mouth daily. 09/16/16   [provider]  terbinafine (LAMISIL) 250 MG tablet Take 1 tablet (250 mg total) by mouth daily. 12/31/20   Felecia Shelling, DPM  triamcinolone (NASACORT) 55 MCG/ACT AERO nasal inhaler Place 2 sprays into the nose daily. 05/08/21   Rhys Martini, PA-C      Allergies    Patient has no known allergies.    Review of Systems   Review of Systems  Constitutional:  Positive for chills.  Skin:  Positive for wound.  All other systems reviewed and are negative.  Physical Exam Updated Vital Signs BP 138/83 (BP Location: Left Arm)   Pulse 79   Temp 97.8 F (36.6 C) (Oral)   Resp 18   Ht 5' 3.5" (1.613 m)   Wt 89.8 kg   SpO2 100%   BMI 34.52 kg/m  Physical Exam Vitals and nursing note reviewed.  Constitutional:      General: She is not in acute distress.    Appearance: Normal appearance. She is not ill-appearing or toxic-appearing.  HENT:     Head: Normocephalic.  Eyes:  General: No scleral icterus.    Extraocular Movements: Extraocular movements intact.  Cardiovascular:     Pulses: Normal pulses.  Pulmonary:     Effort: Pulmonary effort is normal. No respiratory distress.  Musculoskeletal:        General: Normal range of motion.  Skin:    General: Skin is warm and dry.     Capillary Refill: Capillary refill takes less than 2 seconds.     Findings: Erythema present.     Comments: 1-1.5 centimeter abscess with fluctuance Large 10 cm area of redness surrounding  Neurological:     General: No focal deficit present.     Mental Status: She is alert and oriented to person, place, and time. Mental status is at baseline.  Psychiatric:        Mood and Affect:  Mood normal.        Behavior: Behavior normal.        Thought Content: Thought content normal.        Judgment: Judgment normal.    ED Results / Procedures / Treatments   Labs (all labs ordered are listed, but only abnormal results are displayed) Labs Reviewed  BASIC METABOLIC PANEL - Abnormal; Notable for the following components:      Result Value   CO2 21 (*)    All other components within normal limits  CBC WITH DIFFERENTIAL/PLATELET   EKG None  Radiology No results found.  Procedures Ultrasound ED Soft Tissue  Date/Time: 11/27/2021 2:20 AM Performed by: Cristopher PeruAutry, Xzaiver Vayda E, PA-C Authorized by: Cristopher PeruAutry, Pao Haffey E, PA-C   Procedure details:    Indications: localization of abscess     Transverse view:  Visualized   Longitudinal view:  Visualized   Images: not archived   Location:    Location: lower extremity     Side:  Right Findings:     abscess present .Marland Kitchen.Incision and Drainage  Date/Time: 11/27/2021 3:08 AM Performed by: Cristopher PeruAutry, Enrica Corliss E, PA-C Authorized by: Cristopher PeruAutry, Gorgeous Newlun E, PA-C   Consent:    Consent obtained:  Verbal   Consent given by:  Patient   Risks, benefits, and alternatives were discussed: yes     Risks discussed:  Bleeding, incomplete drainage, pain and infection   Alternatives discussed:  No treatment and delayed treatment Universal protocol:    Procedure explained and questions answered to patient or proxy's satisfaction: yes     Relevant documents present and verified: yes     Imaging studies available: yes     Required blood products, implants, devices, and special equipment available: yes     Site/side marked: yes     Patient identity confirmed:  Verbally with patient Location:    Type:  Abscess   Size:  1cm   Location:  Lower extremity   Lower extremity location:  Leg   Leg location:  R upper leg Pre-procedure details:    Skin preparation:  Povidone-iodine Sedation:    Sedation type:  None Anesthesia:    Anesthesia method:  Local  infiltration   Local anesthetic:  Lidocaine 1% w/o epi Procedure type:    Complexity:  Simple Procedure details:    Ultrasound guidance: yes     Incision types:  Stab incision   Incision depth:  Subcutaneous   Wound management:  Probed and deloculated and irrigated with saline   Drainage:  Purulent and bloody   Drainage amount:  Moderate   Wound treatment:  Wound left open   Packing materials:  1/4 in iodoform gauze  Amount 1/4" iodoform:  3cm Post-procedure details:    Procedure completion:  Tolerated well, no immediate complications   Medications Ordered in ED Medications  lidocaine (PF) (XYLOCAINE) 1 % injection 10 mL (has no administration in time range)    ED Course/ Medical Decision Making/ A&P                           Medical Decision Making Amount and/or Complexity of Data Reviewed Labs: ordered.  Risk Prescription drug management.  This patient presents to the ED for concern of abscess, this involves an extensive number of treatment options, and is a complaint that carries with it a high risk of complications and morbidity.  The differential diagnosis includes abscess, cellulitis  Co morbidities that complicate the patient evaluation None  Additional history obtained:  Additional history obtained from: None External records from outside source obtained and reviewed including: No pertinent available  Lab Results: I personally ordered, reviewed, and interpreted labs. Pertinent results include: CBC and BMP normal  Imaging Studies ordered:  Bedside ultrasound with abscess present  Medications  I ordered medication including lidocaine for local infiltration Reevaluation of the patient after medication shows that patient improved -I reviewed the patient's home medications and did not make adjustments. -I did  prescribe new home medications.  Critical Interventions: I/D  ED Course: 21 year old female who presents to the emergency department with right  lateral thigh abscess.  PE consistent with abscess. There is fluctuance noted to the area of concern. Surrounding erythema/cellulitis.  Performed bedside US which shows abscess formation and collection to drain. Performed incision and drainage with purulent discharge. Area was irrigated. Packed Labs for complaint of chills. No fever here. No leukocytosis. Doubt systemic infection She states she was started on antibiotics from urgent care. I cannot verify this on her chart. She cannot tell me which antibiotics. I will prescribe Bactrim for her. She agrees to start taking Bactrim. Given return precautions for systemic symptoms, recollection, fever. She verbalizes understanding.   After consideration of the diagnostic results and the patients response to treatment, I feel that the patent would benefit from discharge. The patient has been appropriately medically screened and/or stabilized in the ED. I have low suspicion for any other emergent medical condition which would require further screening, evaluation or treatment in the ED or require inpatient management. The patient is overall well appearing and non-toxic in appearance. They are hemodynamically stable at time of discharge.   Final Clinical Impression(s) / ED Diagnoses Final diagnoses:  Abscess    Rx / DC Orders ED Discharge Orders          Ordered    sulfamethoxazole-trimethoprim (BACTRIM DS) 800-160 MG tablet  2 times daily        11/27/21 0311              Cristopher Peru, PA-C 11/27/21 0533    Molpus, Jonny Ruiz, MD 11/27/21 [phone]

## 2021-11-27 NOTE — ED Triage Notes (Signed)
Pt got new tattoo on R thigh 2 weeks ago. Abscess that came up 2 days ago, non draining. Red circular inflammation around it. She was seen at [facility] today and told to use warm compresses and started on antibiotics. Came tonight bc woke up due to pain.

## 2021-11-27 NOTE — Discharge Instructions (Addendum)
You were seen in the emergency department today for an abscess.  While you are here we drained it.  I am also placing you on antibiotics for the surrounding redness that you have on your skin.  Please return to the emergency department if you have a recollection of your abscess or begin to have fever or nausea.  We placed packing into the wound.  Please keep this in for 48 hours prior to removing it.  Please keep the area covered with a simple gauze and tape until it is closing.

## 2022-01-10 ENCOUNTER — Emergency Department (HOSPITAL_COMMUNITY)
Admission: EM | Admit: 2022-01-10 | Discharge: 2022-01-10 | Disposition: A | Discharge status: Home / Self Care | Payer: Medicaid Other | Attending: Student | Admitting: Student

## 2022-01-10 ENCOUNTER — Other Ambulatory Visit: Payer: Self-pay

## 2022-01-10 ENCOUNTER — Encounter (HOSPITAL_COMMUNITY): Payer: Self-pay

## 2022-01-10 ENCOUNTER — Emergency Department (HOSPITAL_COMMUNITY): Payer: Medicaid Other

## 2022-01-10 DIAGNOSIS — R112 Nausea with vomiting, unspecified: Secondary | ICD-10-CM | POA: Insufficient documentation

## 2022-01-10 DIAGNOSIS — N9489 Other specified conditions associated with female genital organs and menstrual cycle: Secondary | ICD-10-CM | POA: Insufficient documentation

## 2022-01-10 DIAGNOSIS — R1084 Generalized abdominal pain: Secondary | ICD-10-CM | POA: Diagnosis not present

## 2022-01-10 DIAGNOSIS — R197 Diarrhea, unspecified: Secondary | ICD-10-CM | POA: Diagnosis not present

## 2022-01-10 DIAGNOSIS — R1032 Left lower quadrant pain: Secondary | ICD-10-CM | POA: Diagnosis present

## 2022-01-10 LAB — URINALYSIS, ROUTINE W REFLEX MICROSCOPIC
Bilirubin Urine: NEGATIVE
Glucose, UA: NEGATIVE mg/dL
Hgb urine dipstick: NEGATIVE
Ketones, ur: NEGATIVE mg/dL
Nitrite: NEGATIVE
Protein, ur: NEGATIVE mg/dL
Specific Gravity, Urine: 1.019 (ref 1.005–1.030)
WBC, UA: >50 WBC/hpf — ABNORMAL HIGH (ref 0–5)
pH: 5 (ref 5.0–8.0)

## 2022-01-10 LAB — COMPREHENSIVE METABOLIC PANEL
ALT: 27 U/L (ref 0–44)
AST: 22 U/L (ref 15–41)
Albumin: 4 g/dL (ref 3.5–5.0)
Alkaline Phosphatase: 64 U/L (ref 38–126)
Anion gap: 7 (ref 5–15)
BUN: 18 mg/dL (ref 6–20)
CO2: 22 mmol/L (ref 22–32)
Calcium: 8.9 mg/dL (ref 8.9–10.3)
Chloride: 109 mmol/L (ref 98–111)
Creatinine, Ser: 0.6 mg/dL (ref 0.44–1.00)
GFR, Estimated: >60 mL/min (ref >60)
Glucose, Bld: 91 mg/dL (ref 70–99)
Potassium: 3.6 mmol/L (ref 3.5–5.1)
Sodium: 138 mmol/L (ref 135–145)
Total Bilirubin: 1.4 mg/dL — ABNORMAL HIGH (ref 0.3–1.2)
Total Protein: 7.5 g/dL (ref 6.5–8.1)

## 2022-01-10 LAB — CBC WITH DIFFERENTIAL/PLATELET
Abs Immature Granulocytes: 0.02 10*3/uL (ref 0.00–0.07)
Basophils Absolute: 0 10*3/uL (ref 0.0–0.1)
Basophils Relative: 0 %
Eosinophils Absolute: 0.1 10*3/uL (ref 0.0–0.5)
Eosinophils Relative: 1 %
HCT: 41.4 % (ref 36.0–46.0)
Hemoglobin: 13.6 g/dL (ref 12.0–15.0)
Immature Granulocytes: 0 %
Lymphocytes Relative: 31 %
Lymphs Abs: 2.1 10*3/uL (ref 0.7–4.0)
MCH: 30.1 pg (ref 26.0–34.0)
MCHC: 32.9 g/dL (ref 30.0–36.0)
MCV: 91.6 fL (ref 80.0–100.0)
Monocytes Absolute: 0.6 10*3/uL (ref 0.1–1.0)
Monocytes Relative: 9 %
Neutro Abs: 4 10*3/uL (ref 1.7–7.7)
Neutrophils Relative %: 59 %
Platelets: 253 10*3/uL (ref 150–400)
RBC: 4.52 MIL/uL (ref 3.87–5.11)
RDW: 13.6 % (ref 11.5–15.5)
WBC: 6.9 10*3/uL (ref 4.0–10.5)
nRBC: 0 % (ref 0.0–0.2)

## 2022-01-10 LAB — RAPID URINE DRUG SCREEN, HOSP PERFORMED
Amphetamines: NOT DETECTED
Barbiturates: NOT DETECTED
Benzodiazepines: NOT DETECTED
Cocaine: NOT DETECTED
Opiates: NOT DETECTED
Tetrahydrocannabinol: NOT DETECTED

## 2022-01-10 LAB — I-STAT BETA HCG BLOOD, ED (MC, WL, AP ONLY): I-stat hCG, quantitative: <5 m[IU]/mL (ref <5)

## 2022-01-10 LAB — LIPASE, BLOOD: Lipase: 33 U/L (ref 11–51)

## 2022-01-10 MED ORDER — DICYCLOMINE HCL 20 MG PO TABS: 20.0000 mg | ORAL_TABLET | Freq: Two times a day (BID) | ORAL | 0 refills | Status: AC

## 2022-01-10 NOTE — ED Notes (Signed)
Patient requesting to not have rectal temp. EDP made aware. Patient provided with water and aware of needing urine sample.

## 2022-01-10 NOTE — ED Provider Notes (Signed)
[facility]-EMERGENCY DEPT Provider Note   CSN: [id number] Arrival date & time: 01/10/22  1032     History  Chief Complaint  Patient presents with   Abdominal Pain    Haley Collins is a 21 y.o. female.  The history is provided by the patient. No language interpreter was used.  Abdominal Pain    21 yo Female with PMHx significant for ADHD and Oppositional defiant disorder p/w 1 month of constant mild to moderate LLQ abdominal pain a/w nausea, and non-bloody vomiting/diarrhea. She has not been evaluated by PCP for current complaints. Pain is exacerbated with po intake, and mildly improves when patient lays on her right side. +anorexia secondary to current symptoms. Within the last 24hours patient reports only eating some chips. She is tolerating water and Gatorade intake well. Pt denies episodes of emesis or diarrhea today. last episode of emesis was yesterday PM. Today she is acutely concerned as her symptoms are inhibiting her from remaining focused and present at work. When she does have diarrhea, she reports her abdominal pain worsens and her anus "burns". No fevers, trauma, CP, SOB, headache, vision changes, urinary complaints, or any other acute concerns.   Home Medications Prior to Admission medications   Medication Sig Start Date End Date Taking? Authorizing Provider  cyclobenzaprine (FLEXERIL) 10 MG tablet Take 1 tablet (10 mg total) by mouth 2 (two) times daily as needed for muscle spasms. Patient not taking: Reported on 11/27/2021 10/25/21   Wynetta Fines, MD  ibuprofen (ADVIL) 800 MG tablet Take 800 mg by mouth every 8 (eight) hours as needed for moderate pain. 09/30/21   [provider]  MedroxyPROGESTERone Acetate 150 MG/ML SUSY See admin instructions. 09/02/16   [provider]      Allergies    Patient has no known allergies.    Review of Systems   Review of Systems  Gastrointestinal:  Positive for abdominal pain.  All other  systems reviewed and are negative.   Physical Exam Updated Vital Signs BP 132/75   Pulse 79   Temp 100.1 F (37.8 C) (Oral)   Resp 16   Ht 5' 3.5" (1.613 m)   Wt 92.5 kg   SpO2 98%   BMI 35.57 kg/m  Physical Exam Vitals and nursing note reviewed.  Constitutional:      General: She is not in acute distress.    Appearance: She is well-developed.  HENT:     Head: Atraumatic.  Eyes:     Conjunctiva/sclera: Conjunctivae normal.  Cardiovascular:     Rate and Rhythm: Normal rate and regular rhythm.  Pulmonary:     Effort: Pulmonary effort is normal.  Abdominal:     Tenderness: There is abdominal tenderness in the left lower quadrant.  Musculoskeletal:     Cervical back: Neck supple.  Skin:    Findings: No rash.  Neurological:     Mental Status: She is alert.  Psychiatric:        Mood and Affect: Mood normal.     ED Results / Procedures / Treatments   Labs (all labs ordered are listed, but only abnormal results are displayed) Labs Reviewed  COMPREHENSIVE METABOLIC PANEL - Abnormal; Notable for the following components:      Result Value   Total Bilirubin 1.4 (*)    All other components within normal limits  CBC WITH DIFFERENTIAL/PLATELET  LIPASE, BLOOD  URINALYSIS, ROUTINE W REFLEX MICROSCOPIC  RAPID URINE DRUG SCREEN, HOSP PERFORMED  I-STAT BETA HCG BLOOD,  ED ([facility], [facility], [facility] ONLY)  I-STAT BETA HCG BLOOD, ED ([facility], [facility], [facility] ONLY)    EKG None  Radiology DG Chest 2 View  Result Date: 01/10/2022 CLINICAL DATA:  Lower abdominal pain for 1 month. Unable to keep anything down. EXAM: CHEST - 2 VIEW COMPARISON:  12/07/2020. FINDINGS: Normal heart, mediastinum and hila. Clear lungs.  No pleural effusion or pneumothorax. Skeletal structures are within normal limits. IMPRESSION: Normal chest radiographs. Electronically Signed   By: Amie Portland M.D.   On: 01/10/2022 11:18    Procedures Procedures    Medications Ordered in ED Medications - No data to display  ED Course/  Medical Decision Making/ A&P                           Medical Decision Making Amount and/or Complexity of Data Reviewed Labs: ordered.   BP 132/75   Pulse 79   Temp 100.1 F (37.8 C) (Oral)   Resp 16   Ht 5' 3.5" (1.613 m)   Wt 92.5 kg   SpO2 98%   BMI 35.57 kg/m   6:40 PM This is a 21 year old female presenting complaining of abdominal pain and associate nausea vomiting diarrhea ongoing for the past month.  Her symptoms is brought on by stress and states she is going through quite a bit of stress with her jobs and personal life.  She wonders if it could be due to IBS.  On exam patient is overall well-appearing without any significant abdominal tenderness.  Vital signs stable.  Labs obtained independently viewed interpreted by me and on reassuring.  Low suspicion for any acute abdominal pathology.  At this time I recommend patient follow-up with her PCP for outpatient evaluation.  Patient voiced understanding and agrees with plan.  Return precaution given.  This patient presents to the ED for concern of abd pain, this involves an extensive number of treatment options, and is a complaint that carries with it a high risk of complications and morbidity.  The differential diagnosis includes IBS, colitis, diverticulitis, appendicitis, gastritis  Co morbidities that complicate the patient evaluation none Additional history obtained:  Additional history obtained from family member External records from outside source obtained and reviewed including EMR and prior labs and imaging  Lab Tests:  I Ordered, and personally interpreted labs.  The pertinent results include:  as above   Cardiac Monitoring:  The patient was maintained on a cardiac monitor.  I personally viewed and interpreted the cardiac monitored which showed an underlying rhythm of: NSR  Medicines ordered and prescription drug management:   Test Considered: as above  Critical Interventions: as above   Problem List  / ED Course: abd pain  N/v/d  Reevaluation:  After the interventions noted above, I reevaluated the patient and found that they have :improved  Social Determinants of Health: none  Dispostion:  After consideration of the diagnostic results and the patients response to treatment, I feel that the patent would benefit from outpt f/u.         Final Clinical Impression(s) / ED Diagnoses Final diagnoses:  Generalized abdominal pain    Rx / DC Orders ED Discharge Orders          Ordered    dicyclomine (BENTYL) 20 MG tablet  2 times daily        01/10/22 1954              Fayrene Helper, PA-C 01/10/22 1955  Glendora Score, MD 01/11/22 [phone]

## 2022-01-10 NOTE — ED Provider Triage Note (Signed)
Emergency Medicine Provider Triage Evaluation Note  Haley Collins , a 21 y.o. female  was evaluated in triage.  Pt complains of abd pain. Recurrent abd pain with n/v/d x 1 month.  Report increase stress working 2 jobs.  Denies fever, chills, cp sob, dysuria.    Review of Systems  Positive: As above Negative: As above  Physical Exam  BP (!) 143/73 (BP Location: Left Arm)   Pulse 87   Temp 100.1 F (37.8 C) (Oral)   Resp 16   Ht 5' 3.5" (1.613 m)   Wt 92.5 kg   SpO2 93%   BMI 35.57 kg/m  Gen:   Awake, no distress   Resp:  Normal effort  MSK:   Moves extremities without difficulty  Other:    Medical Decision Making  Medically screening exam initiated at 10:43 AM.  Appropriate orders placed.  Haley Collins was informed that the remainder of the evaluation will be completed by another provider, this initial triage assessment does not replace that evaluation, and the importance of remaining in the ED until their evaluation is complete.     Fayrene Helper, PA-C 01/10/22 1044

## 2022-01-10 NOTE — ED Triage Notes (Signed)
Pt presents with c/o lower abdominal pain for approx one month. Pt also reports she is unable to keep anything down.

## 2022-01-10 NOTE — Discharge Instructions (Addendum)
You have been evaluated for your symptoms.  Fortunately your labs today did not show any concerning finding.  I suspect your condition may be brought on by stress.  Your symptoms could be related to irritable bowel syndrome.  Please call and follow-up closely with your primary care doctor for outpatient evaluation.  Return if your symptoms worsen or if you have any other concern.

## 2022-01-17 ENCOUNTER — Other Ambulatory Visit: Payer: Self-pay

## 2022-01-17 ENCOUNTER — Emergency Department (HOSPITAL_COMMUNITY)
Admission: EM | Admit: 2022-01-17 | Discharge: 2022-01-17 | Disposition: A | Discharge status: Home / Self Care | Payer: Medicaid Other | Attending: Emergency Medicine | Admitting: Emergency Medicine

## 2022-01-17 ENCOUNTER — Emergency Department (HOSPITAL_COMMUNITY): Payer: Medicaid Other

## 2022-01-17 ENCOUNTER — Encounter (HOSPITAL_COMMUNITY): Payer: Self-pay

## 2022-01-17 DIAGNOSIS — N3000 Acute cystitis without hematuria: Secondary | ICD-10-CM | POA: Diagnosis not present

## 2022-01-17 DIAGNOSIS — M545 Low back pain, unspecified: Secondary | ICD-10-CM | POA: Diagnosis not present

## 2022-01-17 DIAGNOSIS — N390 Urinary tract infection, site not specified: Secondary | ICD-10-CM | POA: Diagnosis not present

## 2022-01-17 DIAGNOSIS — R109 Unspecified abdominal pain: Secondary | ICD-10-CM | POA: Diagnosis present

## 2022-01-17 LAB — CBC WITH DIFFERENTIAL/PLATELET
Abs Immature Granulocytes: 0.02 10*3/uL (ref 0.00–0.07)
Basophils Absolute: 0 10*3/uL (ref 0.0–0.1)
Basophils Relative: 0 %
Eosinophils Absolute: 0.1 10*3/uL (ref 0.0–0.5)
Eosinophils Relative: 1 %
HCT: 37.4 % (ref 36.0–46.0)
Hemoglobin: 12.3 g/dL (ref 12.0–15.0)
Immature Granulocytes: 0 %
Lymphocytes Relative: 41 %
Lymphs Abs: 2.2 10*3/uL (ref 0.7–4.0)
MCH: 30.8 pg (ref 26.0–34.0)
MCHC: 32.9 g/dL (ref 30.0–36.0)
MCV: 93.7 fL (ref 80.0–100.0)
Monocytes Absolute: 0.4 10*3/uL (ref 0.1–1.0)
Monocytes Relative: 8 %
Neutro Abs: 2.7 10*3/uL (ref 1.7–7.7)
Neutrophils Relative %: 50 %
Platelets: 224 10*3/uL (ref 150–400)
RBC: 3.99 MIL/uL (ref 3.87–5.11)
RDW: 13.3 % (ref 11.5–15.5)
WBC: 5.4 10*3/uL (ref 4.0–10.5)
nRBC: 0 % (ref 0.0–0.2)

## 2022-01-17 LAB — URINALYSIS, ROUTINE W REFLEX MICROSCOPIC
Bilirubin Urine: NEGATIVE
Glucose, UA: NEGATIVE mg/dL
Hgb urine dipstick: NEGATIVE
Ketones, ur: NEGATIVE mg/dL
Nitrite: NEGATIVE
Protein, ur: 30 mg/dL — AB
Specific Gravity, Urine: 1.023 (ref 1.005–1.030)
WBC, UA: >50 WBC/hpf — ABNORMAL HIGH (ref 0–5)
pH: 6 (ref 5.0–8.0)

## 2022-01-17 LAB — COMPREHENSIVE METABOLIC PANEL
ALT: 19 U/L (ref 0–44)
AST: 18 U/L (ref 15–41)
Albumin: 3.7 g/dL (ref 3.5–5.0)
Alkaline Phosphatase: 63 U/L (ref 38–126)
Anion gap: 9 (ref 5–15)
BUN: 13 mg/dL (ref 6–20)
CO2: 20 mmol/L — ABNORMAL LOW (ref 22–32)
Calcium: 9.1 mg/dL (ref 8.9–10.3)
Chloride: 113 mmol/L — ABNORMAL HIGH (ref 98–111)
Creatinine, Ser: 0.57 mg/dL (ref 0.44–1.00)
GFR, Estimated: >60 mL/min (ref >60)
Glucose, Bld: 94 mg/dL (ref 70–99)
Potassium: 3.8 mmol/L (ref 3.5–5.1)
Sodium: 142 mmol/L (ref 135–145)
Total Bilirubin: 1.6 mg/dL — ABNORMAL HIGH (ref 0.3–1.2)
Total Protein: 6.9 g/dL (ref 6.5–8.1)

## 2022-01-17 LAB — PREGNANCY, URINE: Preg Test, Ur: NEGATIVE

## 2022-01-17 LAB — LIPASE, BLOOD: Lipase: 32 U/L (ref 11–51)

## 2022-01-17 MED ORDER — IOHEXOL 300 MG/ML  SOLN
100.0000 mL | Freq: Once | INTRAMUSCULAR | Status: AC | PRN
  Administered 2022-01-17: 100 mL via INTRAVENOUS

## 2022-01-17 MED ORDER — IBUPROFEN 800 MG PO TABS: 800.0000 mg | ORAL_TABLET | Freq: Three times a day (TID) | ORAL | 0 refills | Status: DC

## 2022-01-17 MED ORDER — CEFADROXIL 500 MG PO CAPS: 500.0000 mg | ORAL_CAPSULE | Freq: Two times a day (BID) | ORAL | 0 refills | Status: AC

## 2022-01-17 NOTE — ED Provider Notes (Signed)
[facility]-EMERGENCY DEPT Provider Note   CSN: [id number] Arrival date & time: 01/17/22  1113     History  Chief Complaint  Patient presents with   Abdominal Pain   Back Pain    PATRICK SOHM is a 21 y.o. female medical history significant for oppositional defiant disorder who presents with concern for ongoing abdominal pain for greater than a week.  She denies urinary frequency, dysuria, but does endorse some lower pelvic pain that radiates around to her flanks.  She denies fever, chills but does endorse some decreased appetite.  She denies any recent injury.  No previous history of kidney stones.  She denies constipation, diarrhea.  She reports that she was seen and evaluated last Sunday for similar pain, diagnosed with nonspecific abdominal pain, has not had significant relief with Bentyl.   Abdominal Pain Back Pain Associated symptoms: abdominal pain        Home Medications Prior to Admission medications   Medication Sig Start Date End Date Taking? Authorizing Provider  cefadroxil (DURICEF) 500 MG capsule Take 1 capsule (500 mg total) by mouth 2 (two) times daily. 01/17/22  Yes Jessy Cybulski H, PA-C  ibuprofen (ADVIL) 800 MG tablet Take 1 tablet (800 mg total) by mouth 3 (three) times daily. 01/17/22  Yes Zariel Capano H, PA-C  cyclobenzaprine (FLEXERIL) 10 MG tablet Take 1 tablet (10 mg total) by mouth 2 (two) times daily as needed for muscle spasms. Patient not taking: Reported on 11/27/2021 10/25/21   Wynetta Fines, MD  dicyclomine (BENTYL) 20 MG tablet Take 1 tablet (20 mg total) by mouth 2 (two) times daily. 01/10/22   Fayrene Helper, PA-C  MedroxyPROGESTERone Acetate 150 MG/ML SUSY See admin instructions. 09/02/16   [provider]      Allergies    Patient has no known allergies.    Review of Systems   Review of Systems  Gastrointestinal:  Positive for abdominal pain.  Musculoskeletal:  Positive for back pain.  All other  systems reviewed and are negative.   Physical Exam Updated Vital Signs BP 122/77 (BP Location: Left Arm)   Pulse 65   Temp 98.7 F (37.1 C) (Oral)   Resp 18   Ht 5\' 3"  (1.6 m)   Wt 92.1 kg   SpO2 100%   BMI 35.96 kg/m  Physical Exam Vitals and nursing note reviewed.  Constitutional:      General: She is not in acute distress.    Appearance: Normal appearance.  HENT:     Head: Normocephalic and atraumatic.  Eyes:     General:        Right eye: No discharge.        Left eye: No discharge.  Cardiovascular:     Rate and Rhythm: Normal rate and regular rhythm.     Heart sounds: No murmur heard.    No friction rub. No gallop.  Pulmonary:     Effort: Pulmonary effort is normal.     Breath sounds: Normal breath sounds.  Abdominal:     General: Bowel sounds are normal.     Palpations: Abdomen is soft.     Comments: Tenderness to palpation suprapubically, as well as around flank.  No CVA tenderness.  Normal bowel sounds throughout.  No rebound, rigidity, guarding.  Skin:    General: Skin is warm and dry.     Capillary Refill: Capillary refill takes less than 2 seconds.  Neurological:     Mental Status: She is alert  and oriented to person, place, and time.  Psychiatric:        Mood and Affect: Mood normal.        Behavior: Behavior normal.     ED Results / Procedures / Treatments   Labs (all labs ordered are listed, but only abnormal results are displayed) Labs Reviewed  COMPREHENSIVE METABOLIC PANEL - Abnormal; Notable for the following components:      Result Value   Chloride 113 (*)    CO2 20 (*)    Total Bilirubin 1.6 (*)    All other components within normal limits  URINALYSIS, ROUTINE W REFLEX MICROSCOPIC - Abnormal; Notable for the following components:   APPearance HAZY (*)    Protein, ur 30 (*)    Leukocytes,Ua LARGE (*)    WBC, UA >50 (*)    Bacteria, UA RARE (*)    All other components within normal limits  CBC WITH DIFFERENTIAL/PLATELET  LIPASE,  BLOOD  PREGNANCY, URINE    EKG None  Radiology CT ABDOMEN PELVIS W CONTRAST  Result Date: 01/17/2022 CLINICAL DATA:  Worsening lower abdominal pain for 1 month. EXAM: CT ABDOMEN AND PELVIS WITH CONTRAST TECHNIQUE: Multidetector CT imaging of the abdomen and pelvis was performed using the standard protocol following bolus administration of intravenous contrast. RADIATION DOSE REDUCTION: This exam was performed according to the departmental dose-optimization program which includes automated exposure control, adjustment of the mA and/or kV according to patient size and/or use of iterative reconstruction technique. CONTRAST:  OMNIPAQUE IOHEXOL 300 MG/ML  SOLN COMPARISON:  None Available. FINDINGS: Lower Chest: No acute findings. Hepatobiliary: No hepatic masses identified. Gallbladder is unremarkable. No evidence of biliary ductal dilatation. Pancreas:  No mass or inflammatory changes. Spleen: Within normal limits in size and appearance. Adrenals/Urinary Tract: No masses identified. No evidence of ureteral calculi or hydronephrosis. Mild diffuse bladder wall thickening and mucosal enhancement, suspicious for cystitis. Stomach/Bowel: No evidence of obstruction, inflammatory process or abnormal fluid collections. Normal appendix visualized. Vascular/Lymphatic: No pathologically enlarged lymph nodes. No acute vascular findings. Reproductive:  No mass or other significant abnormality. Other:  None. Musculoskeletal:  No suspicious bone lesions identified. IMPRESSION: Mild diffuse bladder wall thickening and mucosal enhancement, suspicious for cystitis. Recommend correlation with urinalysis. Electronically Signed   By: Danae Orleans M.D.   On: 01/17/2022 14:00    Procedures Procedures    Medications Ordered in ED Medications  iohexol (OMNIPAQUE) 300 MG/ML solution 100 mL (100 mLs Intravenous Contrast Given 01/17/22 1333)    ED Course/ Medical Decision Making/ A&P                           Medical  Decision Making Risk Prescription drug management.   This patient is a 21 y.o. female who presents to the ED for concern of lower abdominal pain, this involves an extensive number of treatment options, and is a complaint that carries with it a high risk of complications and morbidity. The emergent differential diagnosis prior to evaluation includes, but is not limited to, UTI, pyelonephritis, PID, nephrolithiasis, STI, tubo-ovarian abscess, versus other.   This is not an exhaustive differential.   Past Medical History / Co-morbidities / Social History: Oppositional defiant disorder  Additional history: Chart reviewed. Pertinent results include: Reviewed lab work, imaging from previous emergency department visits  Physical Exam: Physical exam performed. The pertinent findings include: Palpation lower abdomen without rebound, rigidity, guarding, radiates to the flanks.  No CVA tenderness throughout.  Lab  Tests: I ordered, and personally interpreted labs.  The pertinent results include: Urinalysis with greater than 50 white blood cells, some bacteria noted, seemingly consistent with acute UTI, especially combined with the CT findings.  Work-up for the most part overall unremarkable otherwise, she does have mild acidosis with bicarb of 20.   Imaging Studies: I ordered imaging studies including CT abdomen pelvis with contrast. I independently visualized and interpreted imaging which showed bladder wall thickening compatible with acute cystitis.  Although she is not having significant dysuria or urinary frequency the location of her pain seems consistent with UTI, and this finding along with her urinalysis today seems to confirm a diagnosis of UTI.. I agree with the radiologist interpretation.   Disposition: After consideration of the diagnostic results and the patients response to treatment, I feel that patient presentation is consistent with acute UTI, we will treat with cefadroxil.    emergency department workup does not suggest an emergent condition requiring admission or immediate intervention beyond what has been performed at this time. The plan is: as above. The patient is safe for discharge and has been instructed to return immediately for worsening symptoms, change in symptoms or any other concerns.  I discussed this case with my attending physician Dr. Lockie Mola who cosigned this note including patient's presenting symptoms, physical exam, and planned diagnostics and interventions. Attending physician stated agreement with plan or made changes to plan which were implemented.    Final Clinical Impression(s) / ED Diagnoses Final diagnoses:  Lower urinary tract infectious disease  Acute cystitis without hematuria    Rx / DC Orders ED Discharge Orders          Ordered    cefadroxil (DURICEF) 500 MG capsule  2 times daily        01/17/22 1729    ibuprofen (ADVIL) 800 MG tablet  3 times daily        01/17/22 1729              Darril Patriarca, [place] H, PA-C 01/17/22 1735    Virgina Norfolk, DO 01/17/22 2152

## 2022-01-17 NOTE — ED Provider Triage Note (Signed)
Emergency Medicine Provider Triage Evaluation Note  Haley Collins , a 21 y.o. female  was evaluated in triage.  Pt complains of abdominal pain.  Pt seen here  week ago for the same.  Pt reports pain is getting worse.  Review of Systems  Positive: Abdominal pain Negative: fever  Physical Exam  BP 106/82 (BP Location: Left Arm)   Pulse (!) 57   Temp 98.7 F (37.1 C) (Oral)   Resp 18   Ht 5\' 3"  (1.6 m)   Wt 92.1 kg   SpO2 99%   BMI 35.96 kg/m  Gen:   Awake, no distress   Resp:  Normal effort  MSK:   Moves extremities without difficulty  Other:  Abdomen diffusely tender  Medical Decision Making  Medically screening exam initiated at 12:18 PM.  Appropriate orders placed.  was informed that the remainder of the evaluation will be completed by another provider, this initial triage assessment does not replace that evaluation, and the importance of remaining in the ED until their evaluation is complete.     Hanley Hays, Elson Areas 01/17/22 1221

## 2022-01-17 NOTE — ED Triage Notes (Signed)
Pt reports lower abdominal pain for over a month now. Pt has been seen prior to today for same, but reports pain now radiates to lower back. Denies abnormal vaginal discharge.

## 2022-01-17 NOTE — Discharge Instructions (Signed)
Please use Tylenol or ibuprofen for pain.  You may use 600-800 mg ibuprofen every 6 hours or 1000 mg of Tylenol every 6 hours.  You may choose to alternate between the 2.  This would be most effective.  Not to exceed 4 g of Tylenol within 24 hours.  Not to exceed 3200 mg ibuprofen 24 hours.  Take the entire course of antibiotics that we have prescribed, if you are having ongoing pain despite treatment after next 3 to 5 days I recommend following up with your primary care doctor return emergency department for further evaluation.

## 2022-04-11 ENCOUNTER — Encounter (HOSPITAL_COMMUNITY): Payer: Self-pay

## 2022-04-11 ENCOUNTER — Other Ambulatory Visit: Payer: Self-pay

## 2022-04-11 ENCOUNTER — Emergency Department (HOSPITAL_COMMUNITY)
Admission: EM | Admit: 2022-04-11 | Discharge: 2022-04-11 | Disposition: A | Discharge status: Home / Self Care | Payer: Medicaid Other | Attending: Emergency Medicine | Admitting: Emergency Medicine

## 2022-04-11 DIAGNOSIS — R519 Headache, unspecified: Secondary | ICD-10-CM | POA: Insufficient documentation

## 2022-04-11 LAB — CBG MONITORING, ED: Glucose-Capillary: 91 mg/dL (ref 70–99)

## 2022-04-11 MED ORDER — IBUPROFEN 800 MG PO TABS: 800.0000 mg | ORAL_TABLET | Freq: Three times a day (TID) | ORAL | 0 refills | Status: AC | PRN

## 2022-04-11 MED ORDER — KETOROLAC TROMETHAMINE 30 MG/ML IJ SOLN
60.0000 mg | Freq: Once | INTRAMUSCULAR | Status: AC
Start: 2022-04-11 — End: 2022-04-11
  Administered 2022-04-11: 60 mg via INTRAMUSCULAR
  Filled 2022-04-11: qty 2

## 2022-04-11 NOTE — ED Triage Notes (Addendum)
Pt reports sudden onset of headache at work with weakness with sensitivity to light and chills

## 2022-04-11 NOTE — ED Provider Notes (Signed)
  [facility] DEPT Provider Note   CSN: [id number] Arrival date & time: 04/11/22  1330     History {Add pertinent medical, surgical, social history, OB history to HPI:1} Chief Complaint  Patient presents with   Headache    Haley Collins is a 21 y.o. female.  Patient complains of a headache.  Patient has a history of ADHD patient is not been vomiting.   Headache      Home Medications Prior to Admission medications   Medication Sig Start Date End Date Taking? Authorizing Provider  ibuprofen (ADVIL) 800 MG tablet Take 1 tablet (800 mg total) by mouth every 8 (eight) hours as needed. 04/11/22  Yes Milton Ferguson, MD  cefadroxil (DURICEF) 500 MG capsule Take 1 capsule (500 mg total) by mouth 2 (two) times daily. 01/17/22   Prosperi, Christian H, PA-C  cyclobenzaprine (FLEXERIL) 10 MG tablet Take 1 tablet (10 mg total) by mouth 2 (two) times daily as needed for muscle spasms. Patient not taking: Reported on 11/27/2021 10/25/21   Valarie Merino, MD  dicyclomine (BENTYL) 20 MG tablet Take 1 tablet (20 mg total) by mouth 2 (two) times daily. 01/10/22   Domenic Moras, PA-C  MedroxyPROGESTERone Acetate 150 MG/ML SUSY See admin instructions. 09/02/16   [provider]      Allergies    Patient has no known allergies.    Review of Systems   Review of Systems  Neurological:  Positive for headaches.    Physical Exam Updated Vital Signs BP 121/81 (BP Location: Left Arm)   Pulse 67   Temp 97.8 F (36.6 C) (Oral)   Resp 18   Ht 5\' 3"  (1.6 m)   Wt 95.3 kg   SpO2 98%   BMI 37.20 kg/m  Physical Exam  ED Results / Procedures / Treatments   Labs (all labs ordered are listed, but only abnormal results are displayed) Labs Reviewed  CBG MONITORING, ED    EKG None  Radiology No results found.  Procedures Procedures  {Document cardiac monitor, telemetry assessment procedure when appropriate:1}  Medications Ordered in ED Medications   ketorolac (TORADOL) 30 MG/ML injection 60 mg (60 mg Intramuscular Given 04/11/22 1450)    ED Course/ Medical Decision Making/ A&P                           Medical Decision Making Risk Prescription drug management.   Patient with moderate headache.  She improved with Toradol.  She will be sent home with Motrin.  Patient is CBG was normal  {Document critical care time when appropriate:1} {Document review of labs and clinical decision tools ie heart score, Chads2Vasc2 etc:1}  {Document your independent review of radiology images, and any outside records:1} {Document your discussion with family members, caretakers, and with consultants:1} {Document social determinants of health affecting pt's care:1} {Document your decision making why or why not admission, treatments were needed:1} Final Clinical Impression(s) / ED Diagnoses Final diagnoses:  Bad headache    Rx / DC Orders ED Discharge Orders          Ordered    ibuprofen (ADVIL) 800 MG tablet  Every 8 hours PRN        04/11/22 1621

## 2022-04-11 NOTE — Discharge Instructions (Signed)
Follow-up with your family doctor next week for recheck. 

## 2023-01-10 ENCOUNTER — Emergency Department (HOSPITAL_COMMUNITY): Payer: Medicaid Other

## 2023-01-10 ENCOUNTER — Emergency Department (HOSPITAL_COMMUNITY)
Admission: EM | Admit: 2023-01-10 | Discharge: 2023-01-11 | Disposition: A | Discharge status: Home / Self Care | Payer: Medicaid Other | Source: Home / Self Care | Attending: Emergency Medicine | Admitting: Emergency Medicine

## 2023-01-10 ENCOUNTER — Encounter (HOSPITAL_COMMUNITY): Payer: Self-pay

## 2023-01-10 DIAGNOSIS — G43809 Other migraine, not intractable, without status migrainosus: Secondary | ICD-10-CM | POA: Diagnosis not present

## 2023-01-10 DIAGNOSIS — R519 Headache, unspecified: Secondary | ICD-10-CM | POA: Diagnosis present

## 2023-01-10 DIAGNOSIS — R0789 Other chest pain: Secondary | ICD-10-CM | POA: Diagnosis not present

## 2023-01-10 LAB — BASIC METABOLIC PANEL
Anion gap: 10 (ref 5–15)
BUN: 14 mg/dL (ref 6–20)
CO2: 23 mmol/L (ref 22–32)
Calcium: 8.7 mg/dL — ABNORMAL LOW (ref 8.9–10.3)
Chloride: 106 mmol/L (ref 98–111)
Creatinine, Ser: 0.82 mg/dL (ref 0.44–1.00)
GFR, Estimated: >60 mL/min (ref >60)
Glucose, Bld: 90 mg/dL (ref 70–99)
Potassium: 3.4 mmol/L — ABNORMAL LOW (ref 3.5–5.1)
Sodium: 139 mmol/L (ref 135–145)

## 2023-01-10 LAB — CBC
HCT: 40.4 % (ref 36.0–46.0)
Hemoglobin: 13.3 g/dL (ref 12.0–15.0)
MCH: 30.1 pg (ref 26.0–34.0)
MCHC: 32.9 g/dL (ref 30.0–36.0)
MCV: 91.4 fL (ref 80.0–100.0)
Platelets: 320 10*3/uL (ref 150–400)
RBC: 4.42 MIL/uL (ref 3.87–5.11)
RDW: 12.4 % (ref 11.5–15.5)
WBC: 9.2 10*3/uL (ref 4.0–10.5)
nRBC: 0 % (ref 0.0–0.2)

## 2023-01-10 LAB — TROPONIN I (HIGH SENSITIVITY): Troponin I (High Sensitivity): <2 ng/L (ref <18)

## 2023-01-10 LAB — HCG, SERUM, QUALITATIVE: Preg, Serum: NEGATIVE

## 2023-01-10 NOTE — ED Triage Notes (Signed)
Pt states that she was at work and began to have a headache and then chest tightness with dizziness with SOB and nausea

## 2023-01-11 LAB — TROPONIN I (HIGH SENSITIVITY): Troponin I (High Sensitivity): <2 ng/L (ref <18)

## 2023-01-11 MED ORDER — KETOROLAC TROMETHAMINE 15 MG/ML IJ SOLN
15.0000 mg | Freq: Once | INTRAMUSCULAR | Status: AC
  Administered 2023-01-11: 15 mg via INTRAMUSCULAR
  Filled 2023-01-11: qty 1

## 2023-01-11 MED ORDER — SUMATRIPTAN SUCCINATE 25 MG PO TABS: 25.0000 mg | ORAL_TABLET | ORAL | 0 refills | Status: AC | PRN

## 2023-01-11 MED ORDER — DEXAMETHASONE SODIUM PHOSPHATE 10 MG/ML IJ SOLN
10.0000 mg | Freq: Once | INTRAMUSCULAR | Status: AC
  Administered 2023-01-11: 10 mg via INTRAMUSCULAR
  Filled 2023-01-11: qty 1

## 2023-01-11 NOTE — ED Provider Notes (Signed)
[facility] DEPARTMENT AT [facility] Provider Note   CSN: [id number] Arrival date & time: 01/10/23  1911     History  Chief Complaint  Patient presents with   Chest Pain    Haley Collins is a 22 y.o. female.  22 year old female with past medical history of migraines, ADHD presents with complaint of headaches and chest pain.  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  At that same time, she also developed a sharp pain in the center of her chest.  Reports nausea associated with her symptoms, unsure if due to the headache as she typically has nausea with her migraines.  Chest discomfort has been intermittent for the past several hours, nothing makes her symptoms better or worse at this point.  No cardiac history, no family cardiac history.  Notes that she has been out of her Imitrex and wonders if this is why she is having her headache today.  No other complaints or concerns.       Home Medications Prior to Admission medications   Medication Sig Start Date End Date Taking? Authorizing Provider  MedroxyPROGESTERone Acetate 150 MG/ML SUSY Inject 150 mg into the skin every 3 (three) months. 09/02/16  Yes [provider]  cefadroxil (DURICEF) 500 MG capsule Take 1 capsule (500 mg total) by mouth 2 (two) times daily. Patient not taking: Reported on 01/11/2023 01/17/22   Prosperi, Christian H, PA-C  cyclobenzaprine (FLEXERIL) 10 MG tablet Take 1 tablet (10 mg total) by mouth 2 (two) times daily as needed for muscle spasms. Patient not taking: Reported on 11/27/2021 10/25/21   Wynetta Fines, MD  dicyclomine (BENTYL) 20 MG tablet Take 1 tablet (20 mg total) by mouth 2 (two) times daily. Patient not taking: Reported on 01/11/2023 01/10/22   Fayrene Helper, PA-C  ibuprofen (ADVIL) 800 MG tablet Take 1 tablet (800 mg total) by mouth every 8  (eight) hours as needed. Patient not taking: Reported on 01/11/2023 04/11/22   Bethann Berkshire, MD  SUMAtriptan (IMITREX) 25 MG tablet Take 1 tablet (25 mg total) by mouth every 2 (two) hours as needed for migraine or headache. 01/11/23   Jeannie Fend, PA-C      Allergies    Patient has no known allergies.    Review of Systems   Review of Systems Negative except as per HPI Physical Exam Updated Vital Signs BP (!) 110/59 (BP Location: Right Arm)   Pulse (!) 57   Temp 97.6 F (36.4 C) (Oral)   Resp 14   Ht 5\' 3"  (1.6 m)   Wt 108.9 kg   SpO2 99%   BMI 42.51 kg/m  Physical Exam Vitals and nursing note reviewed.  Constitutional:      General: She is not in acute distress.    Appearance: She is well-developed. She is not diaphoretic.  HENT:     Head: Normocephalic and atraumatic.  Cardiovascular:     Rate and Rhythm: Normal rate and regular rhythm.     Heart sounds: Normal heart sounds.  Pulmonary:     Effort: Pulmonary effort is normal.     Breath sounds: Normal breath sounds.  Chest:     Chest wall: Tenderness present.    Abdominal:     Palpations: Abdomen is soft.     Tenderness: There is no abdominal tenderness.  Musculoskeletal:  Cervical back: Neck supple.     Right lower leg: No edema.     Left lower leg: No edema.  Skin:    General: Skin is warm and dry.     Findings: No erythema or rash.  Neurological:     Mental Status: She is alert and oriented to person, place, and time.  Psychiatric:        Behavior: Behavior normal.     ED Results / Procedures / Treatments   Labs (all labs ordered are listed, but only abnormal results are displayed) Labs Reviewed  BASIC METABOLIC PANEL - Abnormal; Notable for the following components:      Result Value   Potassium 3.4 (*)    Calcium 8.7 (*)    All other components within normal limits  CBC  HCG, SERUM, QUALITATIVE  TROPONIN I (HIGH SENSITIVITY)  TROPONIN I (HIGH SENSITIVITY)    EKG None  Radiology DG  Chest 2 View  Result Date: 01/10/2023 CLINICAL DATA:  Chest pain, dizziness, headaches EXAM: CHEST - 2 VIEW COMPARISON:  01/10/2022 FINDINGS: The heart size and mediastinal contours are within normal limits. Both lungs are clear. The visualized skeletal structures are unremarkable. IMPRESSION: No active cardiopulmonary disease. Electronically Signed   By: Ernie Avena M.D.   On: 01/10/2023 20:42    Procedures Procedures    Medications Ordered in ED Medications  ketorolac (TORADOL) 15 MG/ML injection 15 mg (has no administration in time range)  dexamethasone (DECADRON) injection 10 mg (has no administration in time range)    ED Course/ Medical Decision Making/ A&P                             Medical Decision Making Amount and/or Complexity of Data Reviewed Labs: ordered. Radiology: ordered.   This patient presents to the ED for concern of headache, chest pain, this involves an extensive number of treatment options, and is a complaint that carries with it a high risk of complications and morbidity.  The differential diagnosis includes but not limited to migraines, costochondritis, arrhythmia   Co morbidities that complicate the patient evaluation  Migraine, ADHD   Additional history obtained:  External records from outside source obtained and reviewed including medication list reviewed, refill of Imitrex provided   Lab Tests:  I Ordered, and personally interpreted labs.  The pertinent results include: Labs reassuring including troponin less than 2 x 2.  BMP with mild hypokalemia at 3.4.  hCG negative.  CBC normal.   Imaging Studies ordered:  I ordered imaging studies including chest x-ray I independently visualized and interpreted imaging which showed markable I agree with the radiologist interpretation   Cardiac Monitoring: / EKG:  The patient was maintained on a cardiac monitor.  I personally viewed and interpreted the cardiac monitored which showed an  underlying rhythm of: Sinus rhythm, rate 84   Problem List / ED Course / Critical interventions / Medication management  22 year old female presents with complaint of chest pain and headache.  Headache similar to prior migraines, no new or different symptoms.  Is out of her Imitrex.  Treated with Decadron and Toradol, refill for Imitrex sent to her pharmacy.  Regarding her chest pain, she has pain reproduced with palpation of her chest wall, discussed with patient likely musculoskeletal inflammatory nature, recommend Toradol for this as well.  Can continue with NSAIDs at home.  Recheck with PCP.  Workup today is reassuring. I ordered medication including Toradol, Decadron  for headache, chest wall pain Reevaluation of the patient after these medicines showed that the patient  administered at time of discharge I have reviewed the patients home medicines and have made adjustments as needed   Social Determinants of Health:  Has PCP   Test / Admission - Considered:  Stable for discharge with plan to follow-up with PCP.         Final Clinical Impression(s) / ED Diagnoses Final diagnoses:  Chest wall pain  Other migraine without status migrainosus, not intractable    Rx / DC Orders ED Discharge Orders          Ordered    SUMAtriptan (IMITREX) 25 MG tablet  Every 2 hours PRN        01/11/23 0236              Jeannie Fend, PA-C 01/11/23 0244    Gilda Crease, MD 01/11/23 [phone]

## 2023-01-11 NOTE — Discharge Instructions (Signed)
Follow-up with your PCP for recheck.  Return to ER for worsening or concerning symptoms.

## 2023-03-07 ENCOUNTER — Encounter (HOSPITAL_COMMUNITY): Payer: Self-pay

## 2023-03-07 ENCOUNTER — Ambulatory Visit (HOSPITAL_COMMUNITY)
Admission: EM | Admit: 2023-03-07 | Discharge: 2023-03-07 | Disposition: A | Discharge status: Home / Self Care | Payer: Medicaid Other | Attending: Urgent Care | Admitting: Urgent Care

## 2023-03-07 DIAGNOSIS — R6889 Other general symptoms and signs: Secondary | ICD-10-CM | POA: Diagnosis present

## 2023-03-07 DIAGNOSIS — U071 COVID-19: Secondary | ICD-10-CM | POA: Insufficient documentation

## 2023-03-07 DIAGNOSIS — J029 Acute pharyngitis, unspecified: Secondary | ICD-10-CM | POA: Diagnosis present

## 2023-03-07 DIAGNOSIS — R197 Diarrhea, unspecified: Secondary | ICD-10-CM | POA: Diagnosis present

## 2023-03-07 HISTORY — DX: Migraine, unspecified, not intractable, without status migrainosus: G43.909

## 2023-03-07 NOTE — ED Provider Notes (Signed)
[facility]    CSN: [id number] Arrival date & time: 03/07/23  2130      History   Chief Complaint Chief Complaint  Patient presents with   Nasal Congestion   Sore Throat   Generalized Body Aches   Diarrhea    HPI Haley Collins is a 22 y.o. female.    Sore Throat  Diarrhea  Patient presents to urgent care with symptoms x 3 days including nasal congestion, sore throat, body aches, diarrhea.  Endorses elevated temperature measured yesterday of 101.X F, responsive to antipyretics.  She endorses using OTC medication for sore throat.  Past Medical History:  Diagnosis Date   ADHD    Migraine    Oppositional defiant behavior     There are no problems to display for this patient.   History reviewed. No pertinent surgical history.  OB History   No obstetric history on file.      Home Medications    Prior to Admission medications   Medication Sig Start Date End Date Taking? Authorizing Provider  cefadroxil (DURICEF) 500 MG capsule Take 1 capsule (500 mg total) by mouth 2 (two) times daily. Patient not taking: Reported on 01/11/2023 01/17/22   Prosperi, Christian H, PA-C  cyclobenzaprine (FLEXERIL) 10 MG tablet Take 1 tablet (10 mg total) by mouth 2 (two) times daily as needed for muscle spasms. Patient not taking: Reported on 11/27/2021 10/25/21   Wynetta Fines, MD  dicyclomine (BENTYL) 20 MG tablet Take 1 tablet (20 mg total) by mouth 2 (two) times daily. Patient not taking: Reported on 01/11/2023 01/10/22   Fayrene Helper, PA-C  ibuprofen (ADVIL) 800 MG tablet Take 1 tablet (800 mg total) by mouth every 8 (eight) hours as needed. Patient not taking: Reported on 01/11/2023 04/11/22   Bethann Berkshire, MD  MedroxyPROGESTERone Acetate 150 MG/ML SUSY Inject 150 mg into the skin every 3 (three) months. 09/02/16   [provider]  SUMAtriptan (IMITREX) 25 MG tablet Take 1 tablet (25 mg total) by mouth every 2 (two) hours as needed for migraine or headache. 01/11/23    Jeannie Fend, PA-C    Family History Family History  Family history unknown: Yes    Social History Social History   Tobacco Use   Smoking status: Never   Smokeless tobacco: Never  Vaping Use   Vaping status: Never Used  Substance Use Topics   Alcohol use: Yes   Drug use: Not Currently    Types: Marijuana     Allergies   Patient has no known allergies.   Review of Systems Review of Systems  Gastrointestinal:  Positive for diarrhea.     Physical Exam Triage Vital Signs ED Triage Vitals  Encounter Vitals Group     BP 03/07/23 1011 111/76     Systolic BP Percentile --      Diastolic BP Percentile --      Pulse Rate 03/07/23 1011 100     Resp 03/07/23 1011 16     Temp 03/07/23 1011 99 F (37.2 C)     Temp Source 03/07/23 1011 Oral     SpO2 03/07/23 1011 97 %     Weight --      Height --      Head Circumference --      Peak Flow --      Pain Score 03/07/23 1013 9     Pain Loc --      Pain Education --  Exclude from Growth Chart --    No data found.  Updated Vital Signs BP 111/76 (BP Location: Left Arm)   Pulse 100   Temp 99 F (37.2 C) (Oral)   Resp 16   SpO2 97%   Visual Acuity Right Eye Distance:   Left Eye Distance:   Bilateral Distance:    Right Eye Near:   Left Eye Near:    Bilateral Near:     Physical Exam Vitals reviewed.  Constitutional:      Appearance: She is well-developed. She is ill-appearing.  HENT:     Right Ear: Tympanic membrane normal.     Left Ear: Tympanic membrane normal.     Nose: Congestion and rhinorrhea present.     Mouth/Throat:     Mouth: Mucous membranes are moist.     Pharynx: No oropharyngeal exudate or posterior oropharyngeal erythema.     Tonsils: No tonsillar exudate.  Cardiovascular:     Rate and Rhythm: Normal rate and regular rhythm.     Heart sounds: Normal heart sounds.  Pulmonary:     Effort: Pulmonary effort is normal.     Breath sounds: Normal breath sounds.  Musculoskeletal:      Cervical back: Normal range of motion.  Lymphadenopathy:     Cervical: Cervical adenopathy present.  Skin:    General: Skin is warm and dry.  Neurological:     General: No focal deficit present.     Mental Status: She is alert and oriented to person, place, and time.  Psychiatric:        Mood and Affect: Mood normal.        Behavior: Behavior normal.      UC Treatments / Results  Labs (all labs ordered are listed, but only abnormal results are displayed) Labs Reviewed - No data to display  EKG   Radiology No results found.  Procedures Procedures (including critical care time)  Medications Ordered in UC Medications - No data to display  Initial Impression / Assessment and Plan / UC Course  I have reviewed the triage vital signs and the nursing notes.  Pertinent labs & imaging results that were available during my care of the patient were reviewed by me and considered in my medical decision making (see chart for details).   Haley Collins is a 22 y.o. female presenting with flu like symptoms. Patient is afebrile without recent antipyretics, satting well on room air. Overall is ill appearing though non-toxic, well hydrated, without respiratory distress. Pulmonary exam is unremarkable.  Lungs CTAB without wheezing, rhonchi, rales. RRR without murmurs, rubs, gallops.  There is no pharyngeal erythema or peritonsillar exudates.  There is nasal congestion and rhinorrhea.  Positive for cervical adenopathy.  Reviewed relevant chart history.   Patient symptoms are consistent with an acute viral process.  No concern for strep pharyngitis based on clinical exam.  COVID swab was obtained and results pending.  Recommending supportive care and use of over-the-counter medication for symptom control.  Counseled patient on potential for adverse effects with medications prescribed/recommended today, ER and return-to-clinic precautions discussed, patient verbalized understanding and  agreement with care plan.  Final Clinical Impressions(s) / UC Diagnoses   Final diagnoses:  None   Discharge Instructions   None    ED Prescriptions   None    PDMP not reviewed this encounter.   Charma Igo, [place] 03/07/23 1049

## 2023-03-07 NOTE — ED Triage Notes (Signed)
Patient c/o nasal congestion, sore throat, body aches, and diarrhea x 3 days.  Patient states she took an OTC medication yesterday for a sore throat, but does not remember the name.

## 2023-03-07 NOTE — Discharge Instructions (Addendum)
You have been diagnosed with a viral infection which is causing your symptoms.  We are recommending that you continue to use over-the-counter medication.  Tylenol and ibuprofen are most helpful for your throat pain.  Also suggest gargling with warm salt water as well as throat drops or pain relieving sprays.  The results of your COVID swab will be posted to your MyChart account when complete.  Follow up here or with your primary care provider if your symptoms are worsening or not improving with treatment.

## 2023-03-08 LAB — SARS CORONAVIRUS 2 (TAT 6-24 HRS): SARS Coronavirus 2: POSITIVE — AB
